# Patient Record
Sex: Female | Born: 1935 | Race: White | Hispanic: No | Smoking: Never smoker
Health system: Southern US, Community
[De-identification: ages and names within clinical notes are randomized; demographics above are authoritative.]

## PROBLEM LIST (undated history)

## (undated) DIAGNOSIS — K219 Gastro-esophageal reflux disease without esophagitis: Secondary | ICD-10-CM

## (undated) DIAGNOSIS — G4730 Sleep apnea, unspecified: Secondary | ICD-10-CM

## (undated) DIAGNOSIS — K5792 Diverticulitis of intestine, part unspecified, without perforation or abscess without bleeding: Secondary | ICD-10-CM

## (undated) DIAGNOSIS — E785 Hyperlipidemia, unspecified: Secondary | ICD-10-CM

## (undated) DIAGNOSIS — M5090 Cervical disc disorder, unspecified, unspecified cervical region: Secondary | ICD-10-CM

## (undated) DIAGNOSIS — M1990 Unspecified osteoarthritis, unspecified site: Secondary | ICD-10-CM

## (undated) DIAGNOSIS — N200 Calculus of kidney: Secondary | ICD-10-CM

## (undated) DIAGNOSIS — I10 Essential (primary) hypertension: Secondary | ICD-10-CM

## (undated) DIAGNOSIS — R32 Unspecified urinary incontinence: Secondary | ICD-10-CM

## (undated) DIAGNOSIS — K635 Polyp of colon: Secondary | ICD-10-CM

## (undated) DIAGNOSIS — N390 Urinary tract infection, site not specified: Secondary | ICD-10-CM

## (undated) DIAGNOSIS — C801 Malignant (primary) neoplasm, unspecified: Secondary | ICD-10-CM

## (undated) DIAGNOSIS — I509 Heart failure, unspecified: Secondary | ICD-10-CM

## (undated) DIAGNOSIS — I519 Heart disease, unspecified: Secondary | ICD-10-CM

## (undated) DIAGNOSIS — I219 Acute myocardial infarction, unspecified: Secondary | ICD-10-CM

## (undated) DIAGNOSIS — R7302 Impaired glucose tolerance (oral): Secondary | ICD-10-CM

## (undated) HISTORY — DX: Heart disease, unspecified: I51.9

## (undated) HISTORY — DX: Acute myocardial infarction, unspecified: I21.9

## (undated) HISTORY — DX: Calculus of kidney: N20.0

## (undated) HISTORY — DX: Hyperlipidemia, unspecified: E78.5

## (undated) HISTORY — DX: Unspecified urinary incontinence: R32

## (undated) HISTORY — DX: Urinary tract infection, site not specified: N39.0

## (undated) HISTORY — PX: OTHER SURGICAL HISTORY: SHX169

## (undated) HISTORY — DX: Diverticulitis of intestine, part unspecified, without perforation or abscess without bleeding: K57.92

## (undated) HISTORY — DX: Impaired glucose tolerance (oral): R73.02

## (undated) HISTORY — DX: Essential (primary) hypertension: I10

## (undated) HISTORY — DX: Polyp of colon: K63.5

## (undated) HISTORY — DX: Gastro-esophageal reflux disease without esophagitis: K21.9

## (undated) HISTORY — DX: Cervical disc disorder, unspecified, unspecified cervical region: M50.90

## (undated) HISTORY — DX: Unspecified osteoarthritis, unspecified site: M19.90

## (undated) HISTORY — PX: CORONARY ARTERY BYPASS GRAFT: SHX141

## (undated) HISTORY — DX: Malignant (primary) neoplasm, unspecified: C80.1

## (undated) HISTORY — DX: Sleep apnea, unspecified: G47.30

## (undated) HISTORY — DX: Heart failure, unspecified: I50.9

---

## 1999-06-30 ENCOUNTER — Encounter: Payer: Self-pay | Admitting: Emergency Medicine

## 1999-06-30 ENCOUNTER — Emergency Department (HOSPITAL_COMMUNITY): Admission: EM | Admit: 1999-06-30 | Discharge: 1999-06-30 | Payer: Self-pay | Admitting: Emergency Medicine

## 1999-08-12 HISTORY — PX: ABDOMINAL HYSTERECTOMY: SHX81

## 1999-09-12 HISTORY — PX: OTHER SURGICAL HISTORY: SHX169

## 1999-12-03 ENCOUNTER — Ambulatory Visit (HOSPITAL_COMMUNITY): Admission: RE | Admit: 1999-12-03 | Discharge: 1999-12-03 | Payer: Self-pay | Admitting: Oncology

## 1999-12-03 ENCOUNTER — Encounter: Payer: Self-pay | Admitting: Oncology

## 1999-12-25 ENCOUNTER — Encounter: Payer: Self-pay | Admitting: Oncology

## 1999-12-25 ENCOUNTER — Ambulatory Visit (HOSPITAL_COMMUNITY): Admission: RE | Admit: 1999-12-25 | Discharge: 1999-12-25 | Payer: Self-pay | Admitting: Oncology

## 2000-08-11 HISTORY — PX: BREAST BIOPSY: SHX20

## 2001-03-31 ENCOUNTER — Emergency Department (HOSPITAL_COMMUNITY): Admission: EM | Admit: 2001-03-31 | Discharge: 2001-03-31 | Payer: Self-pay | Admitting: Emergency Medicine

## 2003-08-03 ENCOUNTER — Ambulatory Visit (HOSPITAL_COMMUNITY): Admission: RE | Admit: 2003-08-03 | Discharge: 2003-08-03 | Payer: Self-pay | Admitting: Oncology

## 2003-08-12 HISTORY — PX: CATARACT EXTRACTION: SUR2

## 2004-05-15 ENCOUNTER — Encounter: Admission: RE | Admit: 2004-05-15 | Discharge: 2004-05-15 | Payer: Self-pay | Admitting: Oncology

## 2004-08-16 ENCOUNTER — Ambulatory Visit: Payer: Self-pay | Admitting: Oncology

## 2004-10-07 ENCOUNTER — Encounter: Admission: RE | Admit: 2004-10-07 | Discharge: 2004-10-07 | Payer: Self-pay | Admitting: Oncology

## 2005-02-13 ENCOUNTER — Ambulatory Visit: Payer: Self-pay | Admitting: Oncology

## 2005-10-08 ENCOUNTER — Encounter: Admission: RE | Admit: 2005-10-08 | Discharge: 2005-10-08 | Payer: Self-pay | Admitting: Oncology

## 2005-11-04 ENCOUNTER — Ambulatory Visit: Payer: Self-pay | Admitting: Oncology

## 2006-05-11 HISTORY — PX: OTHER SURGICAL HISTORY: SHX169

## 2006-05-14 ENCOUNTER — Ambulatory Visit: Payer: Self-pay | Admitting: Oncology

## 2006-05-18 LAB — CBC WITH DIFFERENTIAL/PLATELET
BASO%: 0.5 % (ref 0.0–2.0)
Basophils Absolute: 0 10*3/uL (ref 0.0–0.1)
Eosinophils Absolute: 0.2 10*3/uL (ref 0.0–0.5)
HCT: 35.6 % (ref 34.8–46.6)
HGB: 12.1 g/dL (ref 11.6–15.9)
LYMPH%: 26 % (ref 14.0–48.0)
MONO#: 0.6 10*3/uL (ref 0.1–0.9)
NEUT#: 3.9 10*3/uL (ref 1.5–6.5)
NEUT%: 61.3 % (ref 39.6–76.8)
Platelets: 251 10*3/uL (ref 145–400)
WBC: 6.3 10*3/uL (ref 3.9–10.0)
lymph#: 1.6 10*3/uL (ref 0.9–3.3)

## 2006-05-18 LAB — MORPHOLOGY
PLT EST: ADEQUATE
RBC Comments: NORMAL

## 2006-11-30 ENCOUNTER — Ambulatory Visit: Payer: Self-pay | Admitting: Oncology

## 2006-12-01 ENCOUNTER — Encounter: Admission: RE | Admit: 2006-12-01 | Discharge: 2006-12-01 | Payer: Self-pay | Admitting: Oncology

## 2006-12-03 LAB — CBC WITH DIFFERENTIAL/PLATELET
BASO%: 0.7 % (ref 0.0–2.0)
EOS%: 4.2 % (ref 0.0–7.0)
HCT: 35.5 % (ref 34.8–46.6)
MCH: 34.1 pg — ABNORMAL HIGH (ref 26.0–34.0)
MCHC: 34.9 g/dL (ref 32.0–36.0)
MONO#: 0.4 10*3/uL (ref 0.1–0.9)
NEUT%: 66.2 % (ref 39.6–76.8)
RBC: 3.64 10*6/uL — ABNORMAL LOW (ref 3.70–5.32)
RDW: 13.2 % (ref 11.3–14.5)
WBC: 5.4 10*3/uL (ref 3.9–10.0)
lymph#: 1.2 10*3/uL (ref 0.9–3.3)

## 2006-12-03 LAB — COMPREHENSIVE METABOLIC PANEL
ALT: 22 U/L (ref 0–35)
AST: 23 U/L (ref 0–37)
CO2: 30 mEq/L (ref 19–32)
Calcium: 10.1 mg/dL (ref 8.4–10.5)
Chloride: 103 mEq/L (ref 96–112)
Creatinine, Ser: 0.84 mg/dL (ref 0.40–1.20)
Sodium: 140 mEq/L (ref 135–145)
Total Protein: 6.6 g/dL (ref 6.0–8.3)

## 2006-12-03 LAB — CANCER ANTIGEN 27.29: CA 27.29: 13 U/mL (ref 0–39)

## 2007-02-10 ENCOUNTER — Encounter: Payer: Self-pay | Admitting: Pulmonary Disease

## 2007-02-21 ENCOUNTER — Encounter: Payer: Self-pay | Admitting: Pulmonary Disease

## 2007-03-24 ENCOUNTER — Encounter: Payer: Self-pay | Admitting: Pulmonary Disease

## 2007-11-26 ENCOUNTER — Encounter: Payer: Self-pay | Admitting: Pulmonary Disease

## 2007-11-30 ENCOUNTER — Ambulatory Visit: Payer: Self-pay | Admitting: Oncology

## 2007-12-02 ENCOUNTER — Encounter: Admission: RE | Admit: 2007-12-02 | Discharge: 2007-12-02 | Payer: Self-pay | Admitting: Oncology

## 2007-12-02 LAB — COMPREHENSIVE METABOLIC PANEL
AST: 24 U/L (ref 0–37)
Albumin: 4 g/dL (ref 3.5–5.2)
Alkaline Phosphatase: 51 U/L (ref 39–117)
BUN: 27 mg/dL — ABNORMAL HIGH (ref 6–23)
Creatinine, Ser: 0.93 mg/dL (ref 0.40–1.20)
Glucose, Bld: 109 mg/dL — ABNORMAL HIGH (ref 70–99)
Potassium: 4 mEq/L (ref 3.5–5.3)
Total Bilirubin: 0.6 mg/dL (ref 0.3–1.2)

## 2007-12-02 LAB — CANCER ANTIGEN 27.29: CA 27.29: 15 U/mL (ref 0–39)

## 2007-12-02 LAB — CBC WITH DIFFERENTIAL/PLATELET
Basophils Absolute: 0 10*3/uL (ref 0.0–0.1)
EOS%: 1.4 % (ref 0.0–7.0)
HGB: 11.5 g/dL — ABNORMAL LOW (ref 11.6–15.9)
LYMPH%: 22 % (ref 14.0–48.0)
MCH: 33.7 pg (ref 26.0–34.0)
MCV: 94.8 fL (ref 81.0–101.0)
MONO%: 3.4 % (ref 0.0–13.0)
Platelets: 288 10*3/uL (ref 145–400)
RBC: 3.42 10*6/uL — ABNORMAL LOW (ref 3.70–5.32)
RDW: 13.7 % (ref 11.3–14.5)

## 2008-06-18 ENCOUNTER — Encounter: Payer: Self-pay | Admitting: Pulmonary Disease

## 2008-12-01 ENCOUNTER — Ambulatory Visit: Payer: Self-pay | Admitting: Oncology

## 2008-12-05 ENCOUNTER — Encounter: Admission: RE | Admit: 2008-12-05 | Discharge: 2008-12-05 | Payer: Self-pay | Admitting: Oncology

## 2008-12-05 LAB — CBC WITH DIFFERENTIAL/PLATELET
EOS%: 10.3 % — ABNORMAL HIGH (ref 0.0–7.0)
Eosinophils Absolute: 0.7 10*3/uL — ABNORMAL HIGH (ref 0.0–0.5)
MCV: 96.4 fL (ref 79.5–101.0)
MONO%: 8.4 % (ref 0.0–14.0)
NEUT#: 4.1 10*3/uL (ref 1.5–6.5)
RBC: 3.68 10*6/uL — ABNORMAL LOW (ref 3.70–5.45)
RDW: 13.6 % (ref 11.2–14.5)
lymph#: 1.3 10*3/uL (ref 0.9–3.3)

## 2008-12-06 LAB — COMPREHENSIVE METABOLIC PANEL
AST: 22 U/L (ref 0–37)
Albumin: 4.1 g/dL (ref 3.5–5.2)
Alkaline Phosphatase: 62 U/L (ref 39–117)
Potassium: 4.3 mEq/L (ref 3.5–5.3)
Sodium: 139 mEq/L (ref 135–145)
Total Protein: 6.8 g/dL (ref 6.0–8.3)

## 2009-12-10 ENCOUNTER — Ambulatory Visit: Payer: Self-pay | Admitting: Oncology

## 2009-12-11 ENCOUNTER — Encounter: Admission: RE | Admit: 2009-12-11 | Discharge: 2009-12-11 | Payer: Self-pay | Admitting: Oncology

## 2009-12-11 LAB — CBC WITH DIFFERENTIAL/PLATELET
Eosinophils Absolute: 0.1 10*3/uL (ref 0.0–0.5)
MCH: 32.8 pg (ref 25.1–34.0)
MCV: 98.1 fL (ref 79.5–101.0)
MONO#: 0.2 10*3/uL (ref 0.1–0.9)
MONO%: 4.3 % (ref 0.0–14.0)
NEUT#: 4.1 10*3/uL (ref 1.5–6.5)
NEUT%: 70.7 % (ref 38.4–76.8)

## 2009-12-11 LAB — VITAMIN D 25 HYDROXY (VIT D DEFICIENCY, FRACTURES): Vit D, 25-Hydroxy: 44 ng/mL (ref 30–89)

## 2009-12-11 LAB — COMPREHENSIVE METABOLIC PANEL
AST: 26 U/L (ref 0–37)
Alkaline Phosphatase: 52 U/L (ref 39–117)
BUN: 14 mg/dL (ref 6–23)
Creatinine, Ser: 1.05 mg/dL (ref 0.40–1.20)
Glucose, Bld: 202 mg/dL — ABNORMAL HIGH (ref 70–99)
Potassium: 4.4 mEq/L (ref 3.5–5.3)
Total Bilirubin: 0.7 mg/dL (ref 0.3–1.2)
Total Protein: 6.8 g/dL (ref 6.0–8.3)

## 2010-03-28 ENCOUNTER — Encounter: Payer: Self-pay | Admitting: Pulmonary Disease

## 2010-07-08 DIAGNOSIS — I428 Other cardiomyopathies: Secondary | ICD-10-CM | POA: Insufficient documentation

## 2010-07-08 DIAGNOSIS — C50919 Malignant neoplasm of unspecified site of unspecified female breast: Secondary | ICD-10-CM | POA: Insufficient documentation

## 2010-07-08 DIAGNOSIS — I2510 Atherosclerotic heart disease of native coronary artery without angina pectoris: Secondary | ICD-10-CM | POA: Insufficient documentation

## 2010-07-08 DIAGNOSIS — E7800 Pure hypercholesterolemia, unspecified: Secondary | ICD-10-CM | POA: Insufficient documentation

## 2010-07-08 DIAGNOSIS — G4710 Hypersomnia, unspecified: Secondary | ICD-10-CM | POA: Insufficient documentation

## 2010-07-08 DIAGNOSIS — F0390 Unspecified dementia without behavioral disturbance: Secondary | ICD-10-CM | POA: Insufficient documentation

## 2010-07-08 DIAGNOSIS — G4730 Sleep apnea, unspecified: Secondary | ICD-10-CM

## 2010-07-08 DIAGNOSIS — I509 Heart failure, unspecified: Secondary | ICD-10-CM | POA: Insufficient documentation

## 2010-07-08 DIAGNOSIS — M810 Age-related osteoporosis without current pathological fracture: Secondary | ICD-10-CM | POA: Insufficient documentation

## 2010-07-08 DIAGNOSIS — G4733 Obstructive sleep apnea (adult) (pediatric): Secondary | ICD-10-CM | POA: Insufficient documentation

## 2010-07-08 DIAGNOSIS — I10 Essential (primary) hypertension: Secondary | ICD-10-CM | POA: Insufficient documentation

## 2010-07-08 DIAGNOSIS — G2581 Restless legs syndrome: Secondary | ICD-10-CM | POA: Insufficient documentation

## 2010-07-09 ENCOUNTER — Ambulatory Visit: Payer: Self-pay | Admitting: Pulmonary Disease

## 2010-07-09 DIAGNOSIS — I219 Acute myocardial infarction, unspecified: Secondary | ICD-10-CM | POA: Insufficient documentation

## 2010-08-13 ENCOUNTER — Telehealth (INDEPENDENT_AMBULATORY_CARE_PROVIDER_SITE_OTHER): Payer: Self-pay | Admitting: *Deleted

## 2010-08-29 ENCOUNTER — Telehealth (INDEPENDENT_AMBULATORY_CARE_PROVIDER_SITE_OTHER): Payer: Self-pay | Admitting: *Deleted

## 2010-09-03 ENCOUNTER — Telehealth (INDEPENDENT_AMBULATORY_CARE_PROVIDER_SITE_OTHER): Payer: Self-pay | Admitting: *Deleted

## 2010-09-10 NOTE — Assessment & Plan Note (Signed)
Summary: self referral for management of osa   Copy to:  Self Referral Primary Provider/Referring Provider:  Alease Frame MD   CC:  Pt currenly on cpap machine and she just needs a yearly follow up. Marland Kitchen  History of Present Illness: The pt is a very pleasant 77y/o female who comes in today as a self referral for management of osa.  She was diagnosed with severe OSA in 2008, where a npsg revealed AHI 64/hr.  She underwent a titration study and found to have optimal cpap pressure of 11cm.  She has been wearing cpap compliantly since, and feels it is working well for her.  She is new to the area, and if requiring a new dme company for supplies and machine maintenance.  She has a heated humidifier, and currently is using a nasal mask.  She goes to bed at 930pm, and arises at 6am to start her day.  She feels that she sleeps well, and is rested upon arising.  She admits that some evenings she will fall asleep on sofa and not wear cpap.  Despite wearing cpap, she still has some sleepiness with reading and watching tv during the day and evening.  She has not gained weight since her last sleep study.  Her epworth score is only 6 today.  She also has a diagnosis of RLS, but denies symptoms in the evening and is unsure if she kicks (but is taking requip currently).   Current Medications (verified): 1)  Alendronate Sodium 70 Mg Tabs (Alendronate Sodium) .... Once A Week 2)  Aricept 10 Mg Tabs (Donepezil Hcl) .... Once Daily 3)  Aspirin 325 Mg Tabs (Aspirin) .... Once Daily 4)  Bipap 5)  Calcium-Vitamin D 600-125 Mg-Unit Tabs (Calcium-Vitamin D) .... Once Daily 6)  Carvedilol 3.125 Mg Tabs (Carvedilol) .... Once Daily 7)  Detrol La 4 Mg Xr24h-Cap (Tolterodine Tartrate) .... Once Daily 8)  Hydrocodone-Acetaminophen 5-500 Mg Tabs (Hydrocodone-Acetaminophen) .Marland Kitchen.. 1 Tablet As Needed Every 6 Hrs 9)  Lipitor 80 Mg Tabs (Atorvastatin Calcium) .... Once Daily 10)  Namenda 10 Mg Tabs (Memantine Hcl) .... Once  Daily 11)  Ropinirole Hcl 1 Mg Tabs (Ropinirole Hcl) .... Once Daily At Bedtime 12)  Sertraline Hcl 100 Mg Tabs (Sertraline Hcl) .... Once Daily 13)  Maxivision Occular Formula .Marland Kitchen.. 1 Tablet Two Times A Day  Allergies (verified): No Known Drug Allergies  Past History:  Past Medical History:  CHF (ICD-428.0) RESTLESS LEGS SYNDROME (ICD-333.94)? HYPERSOMNIA, ASSOCIATED WITH SLEEP APNEA (ICD-780.53) OSTEOPOROSIS (ICD-733.00) OBSTRUCTIVE SLEEP APNEA (ICD-327.23) ESSENTIAL HYPERTENSION (ICD-401.9) HYPERCHOLESTEROLEMIA (ICD-272.0) DEMENTIA (ICD-294.8) CORONARY ATHEROSCLEROSIS (ICD-414.00)--s/p stents, cabg CARDIOMYOPATHY (ICD-425.4)--EF 45% by echo 2009. BREAST CANCER (ICD-174.9)   Myocardial Infarction  Past Surgical History: CABG X3 cataract surgery hysterectomy breast surgery modified radical left mastectomy remove secondary memb catar cornero-scleral sect iridectomy stent x2 carpal tunnel surgery both hands  Family History: Reviewed history from 07/08/2010 and no changes required. Family History Breast Cancer: mother, sister lung cancer--sister Family History Diabetes: mother heart disease--mother clotting disorder: father  Social History: Reviewed history from 07/08/2010 and no changes required. occupation: retired-[employer] lives alone Patient never smoked.   Review of Systems       The patient complains of indigestion, weight change, difficulty swallowing, and ear ache.  The patient denies shortness of breath with activity, shortness of breath at rest, productive cough, non-productive cough, coughing up blood, chest pain, irregular heartbeats, acid heartburn, loss of appetite, abdominal pain, sore throat, tooth/dental problems, headaches, nasal congestion/difficulty breathing through nose,  sneezing, itching, anxiety, depression, hand/feet swelling, joint stiffness or pain, rash, change in color of mucus, and fever.    Vital Signs:  Patient profile:    77 year old female Height:      60 inches Weight:      105.25 pounds BMI:     20.63 O2 Sat:      96 % on Room air Temp:     97.5 degrees F oral Pulse rate:   78 / minute BP sitting:   122 / 64  (right arm) Cuff size:   regular  Vitals Entered By: Carver Fila (July 09, 2010 11:09 AM)  O2 Flow:  Room air CC: Pt currenly on cpap machine and she just needs a yearly follow up.  Comments meds and allergies updated Phone number updated Carver Fila  July 09, 2010 11:09 AM    Physical Exam  General:  wd female in nad Eyes:  PERRLA and EOMI.   Nose:  very narrowed bilat, but no obstruction Mouth:  normal uvula and palate, no exudates. Neck:  no jvd, tmg, LN Lungs:  clear to auscultation Heart:  rrr, no mrg Abdomen:  soft and nontender, bs + Extremities:  minimal edema, no cyanosis  pulses intact distally Neurologic:  alert and oriented, moves all 4.   Impression & Recommendations:  Problem # 1:  OBSTRUCTIVE SLEEP APNEA (ICD-327.23) the pt has a history of severe osa, but has been doing well with cpap.  She is wearing fairly compliantly with no complaints, but does have some daytime alertness issues.  I think at this point, will refer to a new dme company for supplies and machine maintenance.  I also feel that it would be worthwhile to re-optimize pressure for her on auto mode for 2 weeks, especially with some sleepiness issues during the day.  The pt is agreeable to this approach.    Medications Added to Medication List This Visit: 1)  Maxivision Occular Formula  .Marland Kitchen.. 1 tablet two times a day  Other Orders: New Patient Level IV (16109) DME Referral (DME)  Patient Instructions: 1)  will get you referred to a new dme for supplies and machine maintenance 2)  will get you a new mask, and recalibrate your pressure.  I will let you know the results. 3)  followup with me in one year if doing well.   Immunization History:  Influenza Immunization History:    Influenza:   historical (05/11/2010)

## 2010-09-12 NOTE — Letter (Signed)
Summary: [facility]  [facility]   Imported By: Lester Alden 07/27/2010 09:21:35  _____________________________________________________________________  External Attachment:    Type:   Image     Comment:   External Document

## 2010-09-12 NOTE — Progress Notes (Signed)
Summary: regarding titration settings  Phone Note Other Incoming   Caller: Rayla with [facility] Summary of Call: Rayla phoned regarding settings for the titration study. She can take these verbally over the phone it is normally 5 to 50. Rayla can be reached [phone] Initial call taken by: Vedia Coffer,  August 29, 2010 1:14 PM  Follow-up for Phone Call        Spoke with rep from [place] in [place].  She states that they need a range for autotitration study. Verbal order is okay.  KC, pls advise thanks! Follow-up by: Vernie Murders,  August 29, 2010 3:06 PM  Additional Follow-up for Phone Call Additional follow up Details #1::        it is 5-20cm Additional Follow-up by: Barbaraann Share MD,  August 29, 2010 5:44 PM    Additional Follow-up for Phone Call Additional follow up Details #2::    Spoke with Amber at [place] and gave her verbal order for setting of 5-20cm.  Also faxed new setting orders to [facility] at [phone]. Abigail Miyamoto RN  August 30, 2010 8:52 AM

## 2010-09-12 NOTE — Progress Notes (Signed)
Summary: records  Phone Note Other Incoming Call back at [phone]   Caller: [email or web address] Summary of Call: Request rx for cpap supplies, baseline sleep study, and ov prior to sleep study, will fax request. Initial call taken by: Darletta Moll,  August 13, 2010 11:53 AM  Follow-up for Phone Call        [facility] is requesting sleep study records, OV note prior to sleep study and rx for cpap supplies. Looks like this was already sent. I will forward to [facility] to address. Carron Curie CMA  August 13, 2010 3:00 PM   Additional Follow-up for Phone Call Additional follow up Details #1::        records were faxed to [place] [facility] in nov 11 however i refaxed them to [phone] Additional Follow-up by: Oneita Jolly,  August 14, 2010 11:45 AM

## 2010-09-12 NOTE — Progress Notes (Signed)
Summary: [facility] order clarification/cb  Phone Note From Other Clinic   Caller: [phone] [facility] Call For: clance Summary of Call: needs verification on the order 08/30/2010 Initial call taken by: Lacinda Axon,  September 03, 2010 11:21 AM  Follow-up for Phone Call        called and spoke with Tammy Sours from [facility].  Tammy Sours just wanted to verify KC's orders for auto x 2 weeks with download.  Informed Tammy Sours that was correct.  Nothing further needed.  Aundra Millet Reynolds LPN  September 03, 2010 11:27 AM

## 2010-09-28 ENCOUNTER — Encounter: Payer: Self-pay | Admitting: Pulmonary Disease

## 2010-10-02 NOTE — Miscellaneous (Signed)
Summary: optimal cpap 17cm   Clinical Lists Changes  Orders: Added new Referral order of DME Referral (DME) - Signed

## 2010-10-18 ENCOUNTER — Encounter: Payer: Self-pay | Admitting: Pulmonary Disease

## 2010-10-29 ENCOUNTER — Ambulatory Visit (INDEPENDENT_AMBULATORY_CARE_PROVIDER_SITE_OTHER): Payer: Medicare Other | Admitting: Pulmonary Disease

## 2010-10-29 VITALS — BP 96/58 | HR 75 | Temp 97.9°F | Ht 59.0 in | Wt 107.0 lb

## 2010-10-29 DIAGNOSIS — G4733 Obstructive sleep apnea (adult) (pediatric): Secondary | ICD-10-CM

## 2010-10-29 NOTE — Assessment & Plan Note (Signed)
The pt is doing well with cpap at her new optimal pressure.  She is sleeping better, and has improved daytime alertness.  She is having machine malfunctions, and will get her dme to check her machine.  I suspect she needs a new one.

## 2010-10-29 NOTE — Progress Notes (Signed)
  Subjective:    Patient ID: Anne Snyder, female    DOB: [birth year], 77 y.o.   MRN: [id number]  HPI The pt comes in today for f/u of her known osa.  She has had her pressure optimized to 17cm since her last visit, and is tolerating this well.  Her mask fits well, but her machine is making noise and showing an "error message". She has seen a difference in her sleep and daytime alertness.    Review of Systems Patient complains of head congestion and joint stiffness or pain. Pt denies shortness of breath with activity or at rest, productive cough, non productive cough, coughing up blood, chest pain, irregular heartbeats, acid heartburn, indigestion, loss of appetite, weight change abdominal pain, difficulty swallowing, sore throat, headache, sneezing, itching, ear ache, anxiety, depression, hand or feet swelling, rash or fever.     Objective:   Physical Exam  Constitutional: She is oriented to person, place, and time. She appears well-developed.  HENT:       No skin breakdown or pressure necrosis from cpap mask   Musculoskeletal: She exhibits no edema.  Neurological: She is alert and oriented to person, place, and time.       Does not appear sleepy Moves all 4   Skin:       No cyanosis           Assessment & Plan:  OBSTRUCTIVE SLEEP APNEA The pt is doing well with cpap at her new optimal pressure.  She is sleeping better, and has improved daytime alertness.  She is having machine malfunctions, and will get her dme to check her machine.  I suspect she needs a new one.

## 2010-10-29 NOTE — Patient Instructions (Addendum)
Continue on cpap, and call if issues with tolerance Will have dme check your machine, possibly getting you a new one. followup with me in 12mos

## 2010-11-01 ENCOUNTER — Telehealth: Payer: Self-pay | Admitting: Pulmonary Disease

## 2010-11-01 NOTE — Telephone Encounter (Signed)
Called and spoke with pts daughter and she stated that she has been on the phone all afternoon and the DME company has now found her a loaner that will not cost her anything while hers is being repaired.  They will also drop off a humidifier for the pt and they will let them know if her original machine can be fixed.

## 2010-11-01 NOTE — Telephone Encounter (Signed)
It is not unusual for the dme to get hers fixed and GIVE her a loaner until it comes back.  The pt's insurance pays a monthly fee for maintenance, and the loaner should be FREE.  If it is not, they need to talk with dme.  If not making headway, call us back and we can call dme.

## 2010-11-01 NOTE — Telephone Encounter (Signed)
Pt's daughter, Liborio Nixon, says that her mother's CPAP machine has been alarming with an electrical error. The machine is only 67 1/75 years old and [facility] is sending it back to the manufacturer to be checked. Pt is renting a CPAP for $75 a day until they know if hers can be fixed. Daughter says [employer] will not pay for a new one until 2013. Per daughter, she would like to know if there is anyway to get her mother a new CPAP machine before then without her having to pay out of pocket for it. Will forward to Dr. Shelle Iron for recs. Pls advise.Michel Bickers, MA

## 2011-03-12 ENCOUNTER — Encounter: Payer: Self-pay | Admitting: Internal Medicine

## 2011-03-12 DIAGNOSIS — Z0000 Encounter for general adult medical examination without abnormal findings: Secondary | ICD-10-CM | POA: Insufficient documentation

## 2011-03-14 ENCOUNTER — Encounter: Payer: Self-pay | Admitting: Internal Medicine

## 2011-03-14 ENCOUNTER — Other Ambulatory Visit (INDEPENDENT_AMBULATORY_CARE_PROVIDER_SITE_OTHER): Payer: Medicare Other

## 2011-03-14 ENCOUNTER — Telehealth: Payer: Self-pay | Admitting: *Deleted

## 2011-03-14 ENCOUNTER — Ambulatory Visit (INDEPENDENT_AMBULATORY_CARE_PROVIDER_SITE_OTHER): Payer: Medicare Other | Admitting: Internal Medicine

## 2011-03-14 VITALS — BP 108/58 | HR 73 | Temp 97.7°F | Ht 59.0 in | Wt 101.5 lb

## 2011-03-14 DIAGNOSIS — R7309 Other abnormal glucose: Secondary | ICD-10-CM

## 2011-03-14 DIAGNOSIS — R5383 Other fatigue: Secondary | ICD-10-CM

## 2011-03-14 DIAGNOSIS — E7800 Pure hypercholesterolemia, unspecified: Secondary | ICD-10-CM

## 2011-03-14 DIAGNOSIS — E785 Hyperlipidemia, unspecified: Secondary | ICD-10-CM | POA: Insufficient documentation

## 2011-03-14 DIAGNOSIS — I219 Acute myocardial infarction, unspecified: Secondary | ICD-10-CM

## 2011-03-14 DIAGNOSIS — K219 Gastro-esophageal reflux disease without esophagitis: Secondary | ICD-10-CM | POA: Insufficient documentation

## 2011-03-14 DIAGNOSIS — R7302 Impaired glucose tolerance (oral): Secondary | ICD-10-CM

## 2011-03-14 DIAGNOSIS — R5381 Other malaise: Secondary | ICD-10-CM

## 2011-03-14 DIAGNOSIS — Z0000 Encounter for general adult medical examination without abnormal findings: Secondary | ICD-10-CM

## 2011-03-14 DIAGNOSIS — R21 Rash and other nonspecific skin eruption: Secondary | ICD-10-CM | POA: Insufficient documentation

## 2011-03-14 DIAGNOSIS — N200 Calculus of kidney: Secondary | ICD-10-CM | POA: Insufficient documentation

## 2011-03-14 DIAGNOSIS — N390 Urinary tract infection, site not specified: Secondary | ICD-10-CM | POA: Insufficient documentation

## 2011-03-14 DIAGNOSIS — I509 Heart failure, unspecified: Secondary | ICD-10-CM | POA: Insufficient documentation

## 2011-03-14 DIAGNOSIS — Z23 Encounter for immunization: Secondary | ICD-10-CM

## 2011-03-14 DIAGNOSIS — K5792 Diverticulitis of intestine, part unspecified, without perforation or abscess without bleeding: Secondary | ICD-10-CM | POA: Insufficient documentation

## 2011-03-14 DIAGNOSIS — M1990 Unspecified osteoarthritis, unspecified site: Secondary | ICD-10-CM | POA: Insufficient documentation

## 2011-03-14 DIAGNOSIS — G4730 Sleep apnea, unspecified: Secondary | ICD-10-CM | POA: Insufficient documentation

## 2011-03-14 DIAGNOSIS — I10 Essential (primary) hypertension: Secondary | ICD-10-CM | POA: Insufficient documentation

## 2011-03-14 DIAGNOSIS — K635 Polyp of colon: Secondary | ICD-10-CM | POA: Insufficient documentation

## 2011-03-14 HISTORY — DX: Acute myocardial infarction, unspecified: I21.9

## 2011-03-14 HISTORY — DX: Impaired glucose tolerance (oral): R73.02

## 2011-03-14 LAB — URINALYSIS, ROUTINE W REFLEX MICROSCOPIC
Bilirubin Urine: NEGATIVE
Nitrite: NEGATIVE
Specific Gravity, Urine: >=1.03 (ref 1.000–1.030)
Total Protein, Urine: NEGATIVE
pH: 5.5 (ref 5.0–8.0)

## 2011-03-14 LAB — LIPID PANEL
LDL Cholesterol: 43 mg/dL (ref 0–99)
Total CHOL/HDL Ratio: 2
Triglycerides: 56 mg/dL (ref 0.0–149.0)

## 2011-03-14 LAB — HEPATIC FUNCTION PANEL
AST: 21 U/L (ref 0–37)
Alkaline Phosphatase: 72 U/L (ref 39–117)
Bilirubin, Direct: 0.1 mg/dL (ref 0.0–0.3)
Total Bilirubin: 0.7 mg/dL (ref 0.3–1.2)

## 2011-03-14 LAB — CBC WITH DIFFERENTIAL/PLATELET
Eosinophils Absolute: 0.2 10*3/uL (ref 0.0–0.7)
Eosinophils Relative: 2.8 % (ref 0.0–5.0)
Lymphocytes Relative: 25.1 % (ref 12.0–46.0)
MCHC: 33.8 g/dL (ref 30.0–36.0)
MCV: 95.9 fl (ref 78.0–100.0)
Monocytes Relative: 10.5 % (ref 3.0–12.0)
Neutro Abs: 3.7 10*3/uL (ref 1.4–7.7)
Neutrophils Relative %: 60.7 % (ref 43.0–77.0)
RDW: 13.4 % (ref 11.5–14.6)
WBC: 6.1 10*3/uL (ref 4.5–10.5)

## 2011-03-14 LAB — BASIC METABOLIC PANEL
CO2: 29 mEq/L (ref 19–32)
Calcium: 9.7 mg/dL (ref 8.4–10.5)
GFR: 63.76 mL/min (ref >60.00)
Glucose, Bld: 96 mg/dL (ref 70–99)

## 2011-03-14 LAB — HEMOGLOBIN A1C: Hgb A1c MFr Bld: 6 % (ref 4.6–6.5)

## 2011-03-14 MED ORDER — TRIAMCINOLONE ACETONIDE 0.1 % EX CREA: TOPICAL_CREAM | Freq: Two times a day (BID) | CUTANEOUS | Status: DC

## 2011-03-14 MED ORDER — CEPHALEXIN 500 MG PO CAPS: 500.0000 mg | ORAL_CAPSULE | Freq: Four times a day (QID) | ORAL | Status: AC

## 2011-03-14 MED ORDER — PNEUMOCOCCAL VAC POLYVALENT 25 MCG/0.5ML IJ INJ: 0.5000 mL | INJECTION | Freq: Once | INTRAMUSCULAR | Status: DC

## 2011-03-14 MED ORDER — TETANUS-DIPHTH-ACELL PERTUSSIS 5-2.5-18.5 LF-MCG/0.5 IM SUSP: 0.5000 mL | Freq: Once | INTRAMUSCULAR | Status: DC

## 2011-03-14 NOTE — Assessment & Plan Note (Signed)
stable overall by hx and exam, most recent data reviewed with pt, and pt to continue medical treatment as before  Lab Results  Component Value Date   HGBA1C 6.0 03/14/2011

## 2011-03-14 NOTE — Progress Notes (Signed)
Subjective:    Patient ID: Anne Snyder, female    DOB: 25-May-1927, 77 y.o.   MRN: [id number]  HPI  Here to establish as new pt with daughter, pt with dementia, has ongoing fecal incont helped with strawbery gummy fiber pills some worse recently when she stopped the fiber but now improved again without other bowel change or abd pain or blood;  But also with rash to left cheek and temple area with itch that she tends to rub and scratch several times per day for the past 1-2 wks.  Overall o/w doing ok, divorced husband living in [place], pt is now being moved from [place] area to [place], temporarily with one daughter who is here today, but more permanently with another family member locally when the residence is ready.   Denies urinary symptoms such as dysuria, frequency, urgency,or hematuria.   No UTI for over a yr.   Does have sense of ongoing fatigue.   Did also have swallow evaluation spring 2012 negative.  No other new complaints today.  Pt denies chest pain, increased sob or doe, wheezing, orthopnea, PND, increased LE swelling, palpitations, dizziness or syncope.  Pt denies new neurological symptoms such as new headache, or facial or extremity weakness or numbness   Pt denies polydipsia, polyuria. Past Medical History  Diagnosis Date  . Sleep apnea   . Cancer     Breast malignancy  . Diverticulitis   . Heart disease   . Colon polyps   . Urine incontinence   . UTI (lower urinary tract infection)   . Impaired glucose tolerance 03/14/2011  . Arthritis   . CHF (congestive heart failure)   . GERD (gastroesophageal reflux disease)   . Kidney stones   . Hypertension   . Hyperlipidemia   . Myocardial infarct 03/14/2011   Past Surgical History  Procedure Date  . Breast biopsy 2002  . Abdominal hysterectomy 2001  . Heart attack 12/2005 and 01/2006  . By pass surgury 05/2006  . Cataract extraction 2005  . Masectomy 09/1999  . Coronary artery bypass graft     reports that she has never  smoked. She does not have any smokeless tobacco history on file. She reports that she does not drink alcohol or use illicit drugs. family history includes Arthritis in her father, mother, and other; Cancer in her mother and other; Diabetes in her father and mother; and Stroke in her father and mother. Allergies  Allergen Reactions  . Sulfa Antibiotics    Current Outpatient Prescriptions on File Prior to Visit  Medication Sig Dispense Refill  . alendronate (FOSAMAX) 70 MG tablet Take 70 mg by mouth every 7 (seven) days. Take with a full glass of water on an empty stomach.       Marland Kitchen aspirin 325 MG tablet Take 325 mg by mouth daily.        Marland Kitchen atorvastatin (LIPITOR) 80 MG tablet Take 80 mg by mouth daily.        . Calcium Carbonate (CALTRATE 600 PO) Take 1 tablet by mouth 2 (two) times daily.        . carvedilol (COREG) 3.125 MG tablet Take once daily       . donepezil (ARICEPT) 23 MG TABS tablet Take 23 mg by mouth at bedtime.        Marland Kitchen HYDROcodone-acetaminophen (VICODIN) 5-500 MG per tablet Take 1 tablet by mouth every 6 (six) hours as needed.        . memantine (NAMENDA) 10 MG tablet  2 (two) times daily.       . Multiple Vitamins-Minerals (EYE VITAMINS) TABS Take by mouth. Two time a day       . OMEGA-3 KRILL OIL 300 MG CAPS Take 1 capsule by mouth daily.        Marland Kitchen omeprazole (PRILOSEC) 40 MG capsule Take 40 mg by mouth daily.        Marland Kitchen rOPINIRole (REQUIP) 1 MG tablet Take once daily at bedtime       . sertraline (ZOLOFT) 100 MG tablet Take 100 mg by mouth daily.        Marland Kitchen tolterodine (DETROL LA) 4 MG 24 hr capsule Take 4 mg by mouth daily.         No current facility-administered medications on file prior to visit.    Review of Systems Review of Systems  Constitutional: Negative for diaphoresis and unexpected weight change.  HENT: Negative for drooling and tinnitus.   Eyes: Negative for photophobia and visual disturbance.  Respiratory: Negative for choking and stridor.   Gastrointestinal:  Negative for vomiting and blood in stool.     Objective:   Physical Exam BP 108/58  Pulse 73  Temp(Src) 97.7 F (36.5 C) (Oral)  Ht 4\' 11"  (1.499 m)  Wt 101 lb 8 oz (46.04 kg)  BMI 20.50 kg/m2  SpO2 96% Physical Exam  VS noted Constitutional: Pt appears well-developed and well-nourished.  HENT: Head: Normocephalic.  Right Ear: External ear normal.  Left Ear: External ear normal.  Eyes: Conjunctivae and EOM are normal. Pupils are equal, round, and reactive to light.  Neck: Normal range of motion. Neck supple.  Cardiovascular: Normal rate and regular rhythm.   Pulmonary/Chest: Effort normal and breath sounds normal.  Abd:  Soft, NT, non-distended, + BS Neurological: Pt is alert. No cranial nerve deficit.  Skin: Skin is warm. No erythema. except for nondiscrete faint erythem rash, minor tender to left cheek and temple (not shingles as the duaghter is concerned having seen ads on TV recently) Psychiatric: Pt behavior is normal. Thought content c/w moderate ST memeory dysfunction      Assessment & Plan:

## 2011-03-14 NOTE — Telephone Encounter (Signed)
Message copied by Vernie Murders on Fri Mar 14, 2011  6:07 PM ------      Message from: Corwin Levins      Created: Caleen Essex Mar 14, 2011  5:27 PM      Regarding: please call pt's daughter regarding UTI       UA today quite convincing for prob UTI            For cephalexin course  - I sent rx to [facility]

## 2011-03-14 NOTE — Assessment & Plan Note (Signed)
stable overall by hx and exam, most recent data reviewed with pt, and pt to continue medical treatment as before  Lab Results  Component Value Date   LDLCALC 43 03/14/2011

## 2011-03-14 NOTE — Telephone Encounter (Signed)
No answer, left VM for patient to check w/her pharm and call office back w/any questions

## 2011-03-14 NOTE — Assessment & Plan Note (Signed)
Etiology unclear, Exam otherwise benign, to check labs as documented, follow with expectant management  

## 2011-03-14 NOTE — Assessment & Plan Note (Signed)
Exam Not charged   - but for tetanus and pneumonia shot today

## 2011-03-14 NOTE — Patient Instructions (Addendum)
Take all new medications as prescribed - the cream Continue all other medications as before; please have your pharmacy call for any refills you may need Please check your Blood Pressure every 2-3 days, and you should hold on taking the Coreg if the "top number" is less than 100 Please take the Fiber supplement daily Please go to LAB in the Basement for the blood and/or urine tests to be done today Please call the phone number [phone] (the PhoneTree System) for results of testing in 2-3 days;  When calling, simply dial the number, and when prompted enter the MRN number above (the Medical Record Number) and the # key, then the message should start. Please keep your appointments with your specialists as you have planned - Neurology, and Pulmonary (Dr Shelle Iron) Please call if you would also like to establish with Cardiology at [facility] (which I think would be a good idea in the long run) You had the Tetanus and Pneumonia shots today Please return in 6 months, or sooner if needed

## 2011-03-14 NOTE — Assessment & Plan Note (Signed)
Left facial, temporal - c/w dermatitis, for triam cr,  to f/u any worsening symptoms or concerns

## 2011-03-17 NOTE — Telephone Encounter (Signed)
Called patient informed daughter of UA results

## 2011-03-25 ENCOUNTER — Telehealth: Payer: Self-pay

## 2011-03-25 NOTE — Telephone Encounter (Signed)
There are no "safe" decongestants  - all similar to sudafed (pseudoephedrine) or sudafed in it (combination meds) all risk increasing the Blood Pressure  The safest route is to avoid all sudafed containing med; and consider antihistamine if allergy may be an element of the problem, or Mucinex as a "mucolytic", or other med such as Delsym (which is guafenescine) to help with cough

## 2011-03-25 NOTE — Telephone Encounter (Signed)
Pt's daughter called requesting advisement on safe OTC decongestant for pt to use with prescription medications, please advise.

## 2011-03-26 NOTE — Telephone Encounter (Signed)
Pt's daughter advised of MD's recommendation

## 2011-03-30 ENCOUNTER — Encounter: Payer: Self-pay | Admitting: Internal Medicine

## 2011-03-30 DIAGNOSIS — M5090 Cervical disc disorder, unspecified, unspecified cervical region: Secondary | ICD-10-CM | POA: Insufficient documentation

## 2011-03-30 DIAGNOSIS — E559 Vitamin D deficiency, unspecified: Secondary | ICD-10-CM | POA: Insufficient documentation

## 2011-04-22 ENCOUNTER — Other Ambulatory Visit: Payer: Self-pay

## 2011-04-22 MED ORDER — DONEPEZIL HCL 23 MG PO TABS: 23.0000 mg | ORAL_TABLET | Freq: Every day | ORAL | Status: DC

## 2011-04-23 MED ORDER — CARVEDILOL 3.125 MG PO TABS: 3.1250 mg | ORAL_TABLET | Freq: Every day | ORAL | Status: DC

## 2011-04-23 MED ORDER — ROPINIROLE HCL 1 MG PO TABS: ORAL_TABLET | ORAL | Status: DC

## 2011-05-01 ENCOUNTER — Telehealth: Payer: Self-pay

## 2011-05-01 NOTE — Telephone Encounter (Signed)
If not tried already or already taking fiber, please try metamucil daily (or similar fiber supp)

## 2011-05-01 NOTE — Telephone Encounter (Signed)
Pt's daughter called stating pt has been experiencing diarrhea on and off for several months and this was discussed at last OV with MD. Daughter states that diarrheas has return and is requesting advisement from MD.

## 2011-05-02 NOTE — Telephone Encounter (Signed)
If no fever, wt loss, abd pain, blood , she can also take immodium prn, o/w if persists, worsens, or develops the other symtpoms she may need to see GI

## 2011-05-02 NOTE — Telephone Encounter (Signed)
Informed of MD's instructions. They will call back if any changes.

## 2011-05-02 NOTE — Telephone Encounter (Signed)
Called the patient and she is already taking 10 grams daily of fiber gummies. Please advise as informed to add metamucil but she thinks may not help

## 2011-08-06 ENCOUNTER — Other Ambulatory Visit: Payer: Self-pay

## 2011-08-06 MED ORDER — CARVEDILOL 3.125 MG PO TABS: 3.1250 mg | ORAL_TABLET | Freq: Every day | ORAL | Status: DC

## 2011-09-17 ENCOUNTER — Other Ambulatory Visit (INDEPENDENT_AMBULATORY_CARE_PROVIDER_SITE_OTHER): Payer: Medicare Other

## 2011-09-17 ENCOUNTER — Encounter: Payer: Self-pay | Admitting: Internal Medicine

## 2011-09-17 ENCOUNTER — Ambulatory Visit (INDEPENDENT_AMBULATORY_CARE_PROVIDER_SITE_OTHER): Payer: Medicare Other | Admitting: Internal Medicine

## 2011-09-17 VITALS — BP 140/60 | HR 78 | Temp 97.0°F | Ht 59.0 in | Wt 102.4 lb

## 2011-09-17 DIAGNOSIS — R7302 Impaired glucose tolerance (oral): Secondary | ICD-10-CM

## 2011-09-17 DIAGNOSIS — R7309 Other abnormal glucose: Secondary | ICD-10-CM

## 2011-09-17 DIAGNOSIS — E7800 Pure hypercholesterolemia, unspecified: Secondary | ICD-10-CM

## 2011-09-17 DIAGNOSIS — M810 Age-related osteoporosis without current pathological fracture: Secondary | ICD-10-CM

## 2011-09-17 DIAGNOSIS — I10 Essential (primary) hypertension: Secondary | ICD-10-CM

## 2011-09-17 LAB — LIPID PANEL
HDL: 66.1 mg/dL (ref >39.00)
Total CHOL/HDL Ratio: 3
Triglycerides: 83 mg/dL (ref 0.0–149.0)

## 2011-09-17 LAB — BASIC METABOLIC PANEL
CO2: 30 mEq/L (ref 19–32)
Calcium: 9.9 mg/dL (ref 8.4–10.5)
Creatinine, Ser: 0.9 mg/dL (ref 0.4–1.2)
GFR: 66.22 mL/min (ref >60.00)
Sodium: 139 mEq/L (ref 135–145)

## 2011-09-17 LAB — HEMOGLOBIN A1C: Hgb A1c MFr Bld: 6.2 % (ref 4.6–6.5)

## 2011-09-17 MED ORDER — ATORVASTATIN CALCIUM 40 MG PO TABS: 40.0000 mg | ORAL_TABLET | Freq: Every day | ORAL | Status: DC

## 2011-09-17 NOTE — Patient Instructions (Signed)
OK to stop the fosamax and the lipitor 80 mg Start the lipitor 40 mg per day Please go to LAB in the Basement for the blood and/or urine tests to be done today Please call the phone number [phone] (the PhoneTree System) for results of testing in 2-3 days;  When calling, simply dial the number, and when prompted enter the MRN number above (the Medical Record Number) and the # key, then the message should start. Please schedule the bone density at the desk as you leave today If you still have significant osteoporosis, you may qualify to have Prolia Continue all other medications as before Please return in 6 months, or sooner if needed

## 2011-09-21 ENCOUNTER — Encounter: Payer: Self-pay | Admitting: Internal Medicine

## 2011-09-21 NOTE — Assessment & Plan Note (Addendum)
stable overall by hx and exam, most recent data reviewed with pt, and pt to continue medical treatment as before, except to decr the lipitor to 40 mg given the last LDL 43 last visit 6 mo ago Lab Results  Component Value Date   LDLCALC 101* 09/17/2011

## 2011-09-21 NOTE — Assessment & Plan Note (Signed)
stable overall by hx and exam, most recent data reviewed with pt, and pt to continue medical treatment as before  Lab Results  Component Value Date   HGBA1C 6.2 09/17/2011    

## 2011-09-21 NOTE — Assessment & Plan Note (Signed)
stable overall by hx and exam, most recent data reviewed with pt, and pt to continue medical treatment as before  BP Readings from Last 3 Encounters:  09/17/11 140/60  03/14/11 108/58  10/29/10 96/58

## 2011-09-21 NOTE — Assessment & Plan Note (Signed)
D/w family  - to stop the fosamax as has been > 5 yr total tx, for dxa, but would consider prolia

## 2011-09-21 NOTE — Progress Notes (Signed)
Subjective:    Patient ID: Anne Snyder, female    DOB: 1926-11-02, 78 y.o.   MRN: [id number]  HPI  Here to f/u; overall doing ok,  Pt denies chest pain, increased sob or doe, wheezing, orthopnea, PND, increased LE swelling, palpitations, dizziness or syncope.  Pt denies new neurological symptoms such as new headache, or facial or extremity weakness or numbness   Pt denies polydipsia, polyuria, or low sugar symptoms such as weakness or confusion improved with po intake.  Pt states overall good compliance with meds, trying to follow lower cholesterol, diabetic diet, wt overall stable but little exercise however.  Has been on fosamax now approx 7 yrs, due for dxa.  Denies worsening depressive symptoms, suicidal ideation, or panic, though has ongoing anxiety, not increased recently.    Pt denies fever, wt loss, night sweats, loss of appetite, or other constitutional symptoms Past Medical History  Diagnosis Date  . Sleep apnea   . Cancer     Breast malignancy  . Diverticulitis   . Heart disease   . Colon polyps   . Urine incontinence   . UTI (lower urinary tract infection)   . Impaired glucose tolerance 03/14/2011  . Arthritis   . CHF (congestive heart failure)   . GERD (gastroesophageal reflux disease)   . Kidney stones   . Hypertension   . Hyperlipidemia   . Myocardial infarct 03/14/2011  . Cervical disc disease 03/30/2011   Past Surgical History  Procedure Date  . Breast biopsy 2002  . Abdominal hysterectomy 2001  . Heart attack 12/2005 and 01/2006  . By pass surgury 05/2006  . Cataract extraction 2005  . Masectomy 09/1999  . Coronary artery bypass graft     reports that she has never smoked. She does not have any smokeless tobacco history on file. She reports that she does not drink alcohol or use illicit drugs. family history includes Arthritis in her father, mother, and other; Cancer in her mother and other; Diabetes in her father and mother; and Stroke in her father and  mother. Allergies  Allergen Reactions  . Sulfa Antibiotics    Current Outpatient Prescriptions on File Prior to Visit  Medication Sig Dispense Refill  . aspirin 325 MG tablet Take 325 mg by mouth daily.        . Calcium Carbonate (CALTRATE 600 PO) Take 1 tablet by mouth 2 (two) times daily.        . carvedilol (COREG) 3.125 MG tablet Take 1 tablet (3.125 mg total) by mouth daily. Take once daily  90 tablet  3  . donepezil (ARICEPT) 23 MG TABS tablet Take 1 tablet (23 mg total) by mouth at bedtime.  30 tablet  11  . memantine (NAMENDA) 10 MG tablet 2 (two) times daily.       . Multiple Vitamins-Minerals (EYE VITAMINS) TABS Take by mouth. Two time a day       . OMEGA-3 KRILL OIL 300 MG CAPS Take 1 capsule by mouth daily.        Marland Kitchen omeprazole (PRILOSEC) 40 MG capsule Take 40 mg by mouth daily.        Marland Kitchen rOPINIRole (REQUIP) 1 MG tablet Take once daily at bedtime  90 tablet  3  . sertraline (ZOLOFT) 100 MG tablet Take 100 mg by mouth daily.        Marland Kitchen tolterodine (DETROL LA) 4 MG 24 hr capsule Take 4 mg by mouth daily.        Marland Kitchen HYDROcodone-acetaminophen (  VICODIN) 5-500 MG per tablet Take 1 tablet by mouth every 6 (six) hours as needed.        . triamcinolone (KENALOG) 0.1 % cream Apply topically 2 (two) times daily.  30 g  0   Current Facility-Administered Medications on File Prior to Visit  Medication Dose Route Frequency Provider Last Rate Last Dose  . pneumococcal 23 valent vaccine (PNU-IMMUNE) injection 0.5 mL  0.5 mL Intramuscular Once Oliver Barre, MD      . Lady Gary Leda Min) injection 0.5 mL  0.5 mL Intramuscular Once Oliver Barre, MD       Review of Systems Review of Systems  Constitutional: Negative for diaphoresis and unexpected weight change.  HENT: Negative for drooling and tinnitus.   Eyes: Negative for photophobia and visual disturbance.  Respiratory: Negative for choking and stridor.   Gastrointestinal: Negative for vomiting and blood in stool.  Genitourinary: Negative for hematuria  and decreased urine volume. .       Objective:   Physical Exam BP 140/60  Pulse 78  Temp(Src) 97 F (36.1 C) (Oral)  Ht 4\' 11"  (1.499 m)  Wt 102 lb 6 oz (46.437 kg)  BMI 20.68 kg/m2  SpO2 97% Physical Exam  VS noted Constitutional: Pt appears well-developed and well-nourished.  HENT: Head: Normocephalic.  Right Ear: External ear normal.  Left Ear: External ear normal.  Eyes: Conjunctivae and EOM are normal. Pupils are equal, round, and reactive to light.  Neck: Normal range of motion. Neck supple.  Cardiovascular: Normal rate and regular rhythm.   Pulmonary/Chest: Effort normal and breath sounds normal.  Abd:  Soft, NT, non-distended, + BS Neurological: Pt is alert. No cranial nerve deficit.  Skin: Skin is warm. No erythema.  Psychiatric: Pt behavior is normal. not depressed affect or nervous   Assessment & Plan:

## 2011-09-29 ENCOUNTER — Other Ambulatory Visit: Payer: Medicare Other

## 2011-10-01 ENCOUNTER — Ambulatory Visit (INDEPENDENT_AMBULATORY_CARE_PROVIDER_SITE_OTHER)
Admission: RE | Admit: 2011-10-01 | Discharge: 2011-10-01 | Disposition: A | Discharge status: Home / Self Care | Payer: Medicare Other | Source: Ambulatory Visit

## 2011-10-01 DIAGNOSIS — M810 Age-related osteoporosis without current pathological fracture: Secondary | ICD-10-CM

## 2011-10-08 ENCOUNTER — Encounter: Payer: Self-pay | Admitting: Internal Medicine

## 2011-10-29 ENCOUNTER — Ambulatory Visit (INDEPENDENT_AMBULATORY_CARE_PROVIDER_SITE_OTHER): Payer: Medicare Other | Admitting: Pulmonary Disease

## 2011-10-29 ENCOUNTER — Ambulatory Visit (INDEPENDENT_AMBULATORY_CARE_PROVIDER_SITE_OTHER)
Admission: RE | Admit: 2011-10-29 | Discharge: 2011-10-29 | Disposition: A | Discharge status: Home / Self Care | Payer: Medicare Other | Source: Ambulatory Visit | Attending: Internal Medicine | Admitting: Internal Medicine

## 2011-10-29 ENCOUNTER — Encounter: Payer: Self-pay | Admitting: Internal Medicine

## 2011-10-29 ENCOUNTER — Telehealth: Payer: Self-pay | Admitting: Internal Medicine

## 2011-10-29 ENCOUNTER — Encounter: Payer: Self-pay | Admitting: Pulmonary Disease

## 2011-10-29 VITALS — BP 100/68 | HR 92 | Temp 98.0°F | Ht 59.0 in | Wt 101.0 lb

## 2011-10-29 DIAGNOSIS — G4733 Obstructive sleep apnea (adult) (pediatric): Secondary | ICD-10-CM

## 2011-10-29 DIAGNOSIS — M79609 Pain in unspecified limb: Secondary | ICD-10-CM

## 2011-10-29 DIAGNOSIS — M79671 Pain in right foot: Secondary | ICD-10-CM

## 2011-10-29 NOTE — Assessment & Plan Note (Signed)
The patient has a history of severe sleep apnea, and has been wearing CPAP compliantly by her history.  Her daughter feels that she is not as alert during the day as before, and it is unclear if this is even due to a sleep apnea issue.  I will try and get a download off her CPAP sheen to evaluate her problems, and she is also overdue for a new CPAP mask.  She states that she has had a lot of leaks lately.  I've also discussed with the daughter the possibility that her sleepiness has nothing to do with her sleep disordered breathing, but rather with her underlying neurologic issues or medications.

## 2011-10-29 NOTE — Telephone Encounter (Signed)
Patient is aware that order has been put in for xray.

## 2011-10-29 NOTE — Telephone Encounter (Signed)
Pt fell yesterday and wants right foot to have xray, pt is up seeing Dr Shelle Iron, would like done today

## 2011-10-29 NOTE — Progress Notes (Signed)
  Subjective:    Patient ID: Anne Snyder, female    DOB: 10-14-1926, 78 y.o.   MRN: [id number]  HPI The patient comes in today for followup of her known severe obstructive sleep apnea.  She has been wearing CPAP compliantly, and overall feels that it has helped her sleep and daytime alertness.  Her daughter feels that she is not as alert as she has been in the past, and the patient has been having some mask leak issues.  She rarely will pull the mask off during the night.  She also has a history of restless leg syndrome, and has been on Requip.   Review of Systems  Constitutional: Negative for fever and unexpected weight change.  HENT: Positive for ear pain, rhinorrhea and sneezing. Negative for nosebleeds, congestion, sore throat, trouble swallowing, dental problem, postnasal drip and sinus pressure.   Eyes: Negative for redness and itching.  Respiratory: Negative for cough, chest tightness, shortness of breath and wheezing.   Cardiovascular: Positive for leg swelling. Negative for palpitations.  Gastrointestinal: Negative for nausea and vomiting.  Genitourinary: Negative for dysuria.  Musculoskeletal: Negative for joint swelling.  Skin: Negative for rash.  Neurological: Negative for headaches.  Hematological: Does not bruise/bleed easily.  Psychiatric/Behavioral: Negative for dysphoric mood. The patient is not nervous/anxious.        Objective:   Physical Exam Thin female in no acute distress No skin breakdown or pressure necrosis from the CPAP mask Lower extremities without significant edema, no cyanosis Alert and oriented, moves all 4 extremities.       Assessment & Plan:

## 2011-10-29 NOTE — Patient Instructions (Signed)
Will see if we can get download data from your cpap machine, and will discuss any issues with you.  Will check to see if you can get mask cushions more frequently to prevent leaking. If doing well, followup with me in one year.

## 2011-10-29 NOTE — Telephone Encounter (Signed)
Done per emr 

## 2011-10-30 ENCOUNTER — Telehealth: Payer: Self-pay | Admitting: Internal Medicine

## 2011-10-30 DIAGNOSIS — S92909A Unspecified fracture of unspecified foot, initial encounter for closed fracture: Secondary | ICD-10-CM

## 2011-10-30 NOTE — Telephone Encounter (Signed)
Ok for stat murphy wainer referral - Done per [employer]

## 2011-11-24 ENCOUNTER — Ambulatory Visit (INDEPENDENT_AMBULATORY_CARE_PROVIDER_SITE_OTHER): Payer: Medicare Other

## 2011-11-24 DIAGNOSIS — M810 Age-related osteoporosis without current pathological fracture: Secondary | ICD-10-CM

## 2011-11-24 MED ORDER — DENOSUMAB 60 MG/ML ~~LOC~~ SOLN
60.0000 mg | Freq: Once | SUBCUTANEOUS | Status: AC
  Administered 2011-11-24: 60 mg via SUBCUTANEOUS

## 2011-11-30 ENCOUNTER — Other Ambulatory Visit: Payer: Self-pay | Admitting: Pulmonary Disease

## 2011-11-30 ENCOUNTER — Telehealth: Payer: Self-pay | Admitting: Pulmonary Disease

## 2011-11-30 DIAGNOSIS — G4733 Obstructive sleep apnea (adult) (pediatric): Secondary | ICD-10-CM

## 2011-11-30 NOTE — Telephone Encounter (Signed)
Download shows pt only wearing cpap 55/182 days for 4 or more hours at night!!  Some mask leak that is currently being addressed.  Megan, let pt know that she needs to wear this everynight as much as possible.  This may be the reason for your symptoms during the day.  I would also recommend that she be honest with her daughter so that she won't worry herself sick thinking she may have a neurologic problem.

## 2011-12-04 NOTE — Telephone Encounter (Signed)
Called and spoke with pt. Informed her of KC's recs.  Pt verbalized understanding and denied any questions. Instructed pt to call the office if she has any problems that come up regarding cpap machine, mask, etc that may hinder her from wearing it every night all night.  Pt verbalized understanding and will call us if needed.

## 2012-03-01 ENCOUNTER — Telehealth: Payer: Self-pay | Admitting: *Deleted

## 2012-03-01 MED ORDER — TOLTERODINE TARTRATE 2 MG PO TABS: 2.0000 mg | ORAL_TABLET | Freq: Two times a day (BID) | ORAL | Status: DC

## 2012-03-01 NOTE — Telephone Encounter (Signed)
Pt called to let MD know that insurance will no longer cover Detrol LA and she needs a rx sent into pharmacy for generic Detrol 2mg  BID sent to [facility].

## 2012-03-01 NOTE — Telephone Encounter (Signed)
Done per emr 

## 2012-03-01 NOTE — Telephone Encounter (Signed)
Patient informed. 

## 2012-03-22 ENCOUNTER — Ambulatory Visit (INDEPENDENT_AMBULATORY_CARE_PROVIDER_SITE_OTHER): Payer: Medicare Other | Admitting: Internal Medicine

## 2012-03-22 ENCOUNTER — Other Ambulatory Visit (INDEPENDENT_AMBULATORY_CARE_PROVIDER_SITE_OTHER): Payer: Medicare Other

## 2012-03-22 ENCOUNTER — Encounter: Payer: Self-pay | Admitting: Internal Medicine

## 2012-03-22 ENCOUNTER — Ambulatory Visit (INDEPENDENT_AMBULATORY_CARE_PROVIDER_SITE_OTHER)
Admission: RE | Admit: 2012-03-22 | Discharge: 2012-03-22 | Disposition: A | Discharge status: Home / Self Care | Payer: Medicare Other | Source: Ambulatory Visit | Attending: Internal Medicine | Admitting: Internal Medicine

## 2012-03-22 VITALS — BP 110/62 | HR 74 | Temp 97.9°F | Ht 59.0 in | Wt 104.1 lb

## 2012-03-22 DIAGNOSIS — R5383 Other fatigue: Secondary | ICD-10-CM

## 2012-03-22 DIAGNOSIS — F068 Other specified mental disorders due to known physiological condition: Secondary | ICD-10-CM

## 2012-03-22 DIAGNOSIS — I509 Heart failure, unspecified: Secondary | ICD-10-CM

## 2012-03-22 DIAGNOSIS — E559 Vitamin D deficiency, unspecified: Secondary | ICD-10-CM

## 2012-03-22 DIAGNOSIS — R7309 Other abnormal glucose: Secondary | ICD-10-CM

## 2012-03-22 DIAGNOSIS — I10 Essential (primary) hypertension: Secondary | ICD-10-CM

## 2012-03-22 DIAGNOSIS — R7302 Impaired glucose tolerance (oral): Secondary | ICD-10-CM

## 2012-03-22 DIAGNOSIS — E7800 Pure hypercholesterolemia, unspecified: Secondary | ICD-10-CM

## 2012-03-22 DIAGNOSIS — R5381 Other malaise: Secondary | ICD-10-CM

## 2012-03-22 LAB — BASIC METABOLIC PANEL
GFR: 78.49 mL/min (ref >60.00)
Potassium: 4.7 mEq/L (ref 3.5–5.1)
Sodium: 139 mEq/L (ref 135–145)

## 2012-03-22 LAB — HEPATIC FUNCTION PANEL
AST: 24 U/L (ref 0–37)
Alkaline Phosphatase: 65 U/L (ref 39–117)
Bilirubin, Direct: 0.1 mg/dL (ref 0.0–0.3)
Total Bilirubin: 0.6 mg/dL (ref 0.3–1.2)

## 2012-03-22 LAB — URINALYSIS, ROUTINE W REFLEX MICROSCOPIC
Bilirubin Urine: NEGATIVE
Ketones, ur: NEGATIVE
Leukocytes, UA: NEGATIVE
Total Protein, Urine: NEGATIVE
pH: 5.5 (ref 5.0–8.0)

## 2012-03-22 LAB — LIPID PANEL
LDL Cholesterol: 73 mg/dL (ref 0–99)
Total CHOL/HDL Ratio: 2
VLDL: 17.4 mg/dL (ref 0.0–40.0)

## 2012-03-22 LAB — CBC WITH DIFFERENTIAL/PLATELET
Eosinophils Relative: 3 % (ref 0.0–5.0)
HCT: 42.8 % (ref 36.0–46.0)
Lymphs Abs: 1.9 10*3/uL (ref 0.7–4.0)
MCHC: 33.3 g/dL (ref 30.0–36.0)
MCV: 97.6 fl (ref 78.0–100.0)
Monocytes Absolute: 0.5 10*3/uL (ref 0.1–1.0)
Platelets: 329 10*3/uL (ref 150.0–400.0)
RDW: 13.4 % (ref 11.5–14.6)
WBC: 6.7 10*3/uL (ref 4.5–10.5)

## 2012-03-22 LAB — TSH: TSH: 1.87 u[IU]/mL (ref 0.35–5.50)

## 2012-03-22 NOTE — Progress Notes (Signed)
Subjective:    Patient ID: Anne Snyder, female    DOB: [birth year], 78 y.o.   MRN: [id number]  HPI  Here to f/u; overall doing ok, but did have episode 2 days ago of marked fatigue for unclear reasons, some better in the past day or so, pt with dementia - hard to be more specific.  Pt denies chest pain, increased sob or doe, wheezing, orthopnea, PND, increased LE swelling, palpitations, dizziness or syncope.  Pt denies new neurological symptoms such as new headache, or facial or extremity weakness or numbness   Pt denies polydipsia, polyuria.   Pt denies fever, wt loss, night sweats, loss of appetite, or other constitutional symptoms  Dementia overall stable symptomatically with gradual worsening at best, and not assoc with behavioral changes such as hallucinations, paranoia, or agitation. Past Medical History  Diagnosis Date  . Sleep apnea   . Cancer     Breast malignancy  . Diverticulitis   . Heart disease   . Colon polyps   . Urine incontinence   . UTI (lower urinary tract infection)   . Impaired glucose tolerance 03/14/2011  . Arthritis   . CHF (congestive heart failure)   . GERD (gastroesophageal reflux disease)   . Kidney stones   . Hypertension   . Hyperlipidemia   . Myocardial infarct 03/14/2011  . Cervical disc disease 03/30/2011   Past Surgical History  Procedure Date  . Breast biopsy 2002  . Abdominal hysterectomy 2001  . Heart attack 12/2005 and 01/2006  . By pass surgury 05/2006  . Cataract extraction 2005  . Masectomy 09/1999  . Coronary artery bypass graft     reports that she has never smoked. She does not have any smokeless tobacco history on file. She reports that she does not drink alcohol or use illicit drugs. family history includes Arthritis in her father, mother, and other; Cancer in her mother and other; Diabetes in her father and mother; and Stroke in her father and mother. Allergies  Allergen Reactions  . Sulfa Antibiotics    Current Outpatient  Prescriptions on File Prior to Visit  Medication Sig Dispense Refill  . aspirin 325 MG tablet Take 325 mg by mouth daily.        Marland Kitchen atorvastatin (LIPITOR) 40 MG tablet Take 20 mg by mouth daily.      . Calcium Carbonate (CALTRATE 600 PO) Take 1 tablet by mouth 2 (two) times daily.        . carvedilol (COREG) 3.125 MG tablet Take 1 tablet (3.125 mg total) by mouth daily. Take once daily  90 tablet  3  . donepezil (ARICEPT) 23 MG TABS tablet Take 1 tablet (23 mg total) by mouth at bedtime.  30 tablet  11  . memantine (NAMENDA) 10 MG tablet 2 (two) times daily.       . Multiple Vitamin (MULTIVITAMIN) capsule Take 1 capsule by mouth daily.      Marland Kitchen omeprazole (PRILOSEC) 40 MG capsule Take 40 mg by mouth daily.        Marland Kitchen rOPINIRole (REQUIP) 1 MG tablet Take once daily at bedtime  90 tablet  3  . sertraline (ZOLOFT) 100 MG tablet Take 100 mg by mouth daily.        Marland Kitchen tolterodine (DETROL) 2 MG tablet Take 1 tablet (2 mg total) by mouth 2 (two) times daily.  60 tablet  11   Current Facility-Administered Medications on File Prior to Visit  Medication Dose Route Frequency Provider Last Rate Last  Dose  . pneumococcal 23 valent vaccine (PNU-IMMUNE) injection 0.5 mL  0.5 mL Intramuscular Once Corwin Levins, MD      . Lady Gary Leda Min) injection 0.5 mL  0.5 mL Intramuscular Once Corwin Levins, MD       Review of Systems Constitutional: Negative for diaphoresis and unexpected weight change.  HENT: Negative for drooling and tinnitus.   Eyes: Negative for photophobia and visual disturbance.  Respiratory: Negative for choking and stridor.   Gastrointestinal: Negative for vomiting and blood in stool.  Genitourinary: Negative for hematuria and decreased urine volume.  Musculoskeletal: Negative for acute joint swelling Skin: Negative for color change and wound.  Neurological: Negative for tremors and numbness.     Objective:   Physical Exam BP 110/62  Pulse 74  Temp 97.9 F (36.6 C) (Oral)  Ht 4\' 11"  (1.499 m)   Wt 104 lb 2 oz (47.231 kg)  BMI 21.03 kg/m2  SpO2 94% Physical Exam  VS noted Constitutional: Pt appears well-developed and well-nourished.  HENT: Head: Normocephalic.  Right Ear: External ear normal.  Left Ear: External ear normal.  Eyes: Conjunctivae and EOM are normal. Pupils are equal, round, and reactive to light.  Neck: Normal range of motion. Neck supple.  Cardiovascular: Normal rate and regular rhythm.   Pulmonary/Chest: Effort normal and breath sounds normal.  Abd:  Soft, NT, non-distended, + BS Neurological: Pt is alert. Skin: Skin is warm. No erythema.  Psychiatric: Pt behavior is normal.     Assessment & Plan:

## 2012-03-22 NOTE — Assessment & Plan Note (Signed)
With worsening balance recently, encouraged to use cane, for Head MRi as duagter states not done for > 5 yrs

## 2012-03-22 NOTE — Assessment & Plan Note (Signed)
stable overall by hx and exam, most recent data reviewed with pt, and pt to continue medical treatment as before Lab Results  Component Value Date   LDLCALC 73 03/22/2012

## 2012-03-22 NOTE — Assessment & Plan Note (Signed)
stable overall by hx and exam, most recent data reviewed with pt, and pt to continue medical treatment as before BP Readings from Last 3 Encounters:  03/22/12 110/62  10/29/11 100/68  09/17/11 140/60

## 2012-03-22 NOTE — Assessment & Plan Note (Signed)
ECG reviewed as per emr, also for cxr today given the recent fatigue, Continue all other medications as before,  to f/u any worsening symptoms or concerns

## 2012-03-22 NOTE — Patient Instructions (Addendum)
Please sign the release of information form for neurology Your EKG was OK today - and consistent with the history of heart attacks (none new) You will be contacted regarding the referral for: MRI for head  Please go to XRAY in the Basement for the x-ray test - to make sure no "sub-clinical" pneumonia or CHF Please go to LAB in the Basement for the blood and/or urine tests to be done today You will be contacted by phone if any changes need to be made immediately.  Otherwise, you will receive a letter about your results with an explanation. Please have the pharmacy call with any refills you may need., including the aricept and namenda Please return in 6 mo with Lab testing done 3-5 days before

## 2012-03-22 NOTE — Assessment & Plan Note (Signed)
stable overall by hx and exam, most recent data reviewed with pt, and pt to continue medical treatment as before Lab Results  Component Value Date   HGBA1C 6.0 03/22/2012

## 2012-03-22 NOTE — Assessment & Plan Note (Signed)
Etiology unclear, Exam otherwise benign, to check labs as documented, follow with expectant management  

## 2012-03-23 ENCOUNTER — Encounter: Payer: Self-pay | Admitting: Internal Medicine

## 2012-03-23 LAB — VITAMIN D 25 HYDROXY (VIT D DEFICIENCY, FRACTURES): Vit D, 25-Hydroxy: 40 ng/mL (ref 30–89)

## 2012-04-02 ENCOUNTER — Ambulatory Visit
Admission: RE | Admit: 2012-04-02 | Discharge: 2012-04-02 | Disposition: A | Discharge status: Home / Self Care | Payer: Medicare Other | Source: Ambulatory Visit | Attending: Internal Medicine | Admitting: Internal Medicine

## 2012-04-02 ENCOUNTER — Encounter: Payer: Self-pay | Admitting: Internal Medicine

## 2012-04-02 DIAGNOSIS — F068 Other specified mental disorders due to known physiological condition: Secondary | ICD-10-CM

## 2012-04-05 ENCOUNTER — Other Ambulatory Visit: Payer: Self-pay

## 2012-04-05 MED ORDER — SERTRALINE HCL 100 MG PO TABS: 100.0000 mg | ORAL_TABLET | Freq: Every day | ORAL | Status: DC

## 2012-06-22 ENCOUNTER — Telehealth: Payer: Self-pay | Admitting: *Deleted

## 2012-06-22 ENCOUNTER — Ambulatory Visit (INDEPENDENT_AMBULATORY_CARE_PROVIDER_SITE_OTHER): Payer: Medicare Other | Admitting: *Deleted

## 2012-06-22 DIAGNOSIS — M810 Age-related osteoporosis without current pathological fracture: Secondary | ICD-10-CM

## 2012-06-22 MED ORDER — DENOSUMAB 60 MG/ML ~~LOC~~ SOLN
60.0000 mg | Freq: Once | SUBCUTANEOUS | Status: AC
  Administered 2012-06-22: 60 mg via SUBCUTANEOUS

## 2012-06-22 NOTE — Telephone Encounter (Signed)
MD sign form gave to pt/daughter...lmb

## 2012-06-22 NOTE — Telephone Encounter (Signed)
Patient is here for prolia injection. Wanting to get a handi-cap form fill-out...lmb

## 2012-06-24 ENCOUNTER — Ambulatory Visit: Payer: Medicare Other

## 2012-07-17 ENCOUNTER — Other Ambulatory Visit: Payer: Self-pay | Admitting: Internal Medicine

## 2012-08-17 ENCOUNTER — Other Ambulatory Visit: Payer: Self-pay

## 2012-08-17 MED ORDER — DONEPEZIL HCL 23 MG PO TABS: 23.0000 mg | ORAL_TABLET | Freq: Every day | ORAL | Status: DC

## 2012-08-17 MED ORDER — ATORVASTATIN CALCIUM 40 MG PO TABS: 20.0000 mg | ORAL_TABLET | Freq: Every day | ORAL | Status: DC

## 2012-08-17 MED ORDER — ROPINIROLE HCL 1 MG PO TABS: ORAL_TABLET | ORAL | Status: DC

## 2012-08-17 MED ORDER — CARVEDILOL 3.125 MG PO TABS: 3.1250 mg | ORAL_TABLET | Freq: Every day | ORAL | Status: DC

## 2012-08-17 MED ORDER — SERTRALINE HCL 100 MG PO TABS: 100.0000 mg | ORAL_TABLET | Freq: Every day | ORAL | Status: DC

## 2012-08-17 NOTE — Telephone Encounter (Signed)
Done erx 

## 2012-08-18 ENCOUNTER — Other Ambulatory Visit: Payer: Self-pay | Admitting: Internal Medicine

## 2012-08-30 ENCOUNTER — Telehealth: Payer: Self-pay | Admitting: Internal Medicine

## 2012-08-30 MED ORDER — TOLTERODINE TARTRATE 2 MG PO TABS: 2.0000 mg | ORAL_TABLET | Freq: Two times a day (BID) | ORAL | Status: DC

## 2012-08-30 NOTE — Telephone Encounter (Signed)
Ok for refill - this is detrol

## 2012-08-30 NOTE — Telephone Encounter (Signed)
Caller left message stating time for Tolterodine( 2 mg bid) refill.  Further states that this med is not on her meds list.  Does she need to continue taking?

## 2012-08-30 NOTE — Telephone Encounter (Signed)
Ok for refill - this is detrol Solmon Ice 08/30/2012 12:41 PM Signed  Caller left message stating time for Tolterodine( 2 mg bid) refill. Further states that this med is not on her meds list. Does she need to continue taking?          Encounter MyChart Messages

## 2012-08-30 NOTE — Telephone Encounter (Signed)
rx sent in and pt. informed

## 2012-09-01 ENCOUNTER — Telehealth: Payer: Self-pay | Admitting: *Deleted

## 2012-09-01 MED ORDER — OXYBUTYNIN CHLORIDE ER 5 MG PO TB24: 5.0000 mg | ORAL_TABLET | Freq: Every day | ORAL | Status: DC

## 2012-09-01 NOTE — Telephone Encounter (Signed)
PATIENT DAUGHTER CALLED TO REQUEST MEDICATION CHANGE . Rx WAS CALL IN FOR DETROL. STATES THIS IS NOT ON FORMULARY FOR HER INSURANCE. WOULD LIKE TO REPLACE WITH OXYBUTYNIN THAT IS ON HER FORMULARY FOR HER INSURANCE. PLEASE ADVISE. CB#[phone]

## 2012-09-01 NOTE — Telephone Encounter (Signed)
Called left message to call back 

## 2012-09-01 NOTE — Telephone Encounter (Signed)
Patient informed. 

## 2012-09-01 NOTE — Telephone Encounter (Signed)
Ok to change oxybutinin ER 5 qd, sent erx to [facility]

## 2012-09-01 NOTE — Telephone Encounter (Signed)
LEFT MESSAGE ON CB# OF MEDICATION HAS BEEN CHANGED PER DR. Jonny Ruiz AND HAS BEEN SENT TO [facility], TO CALL BACK IF ANY QUESTION

## 2012-09-06 ENCOUNTER — Ambulatory Visit (INDEPENDENT_AMBULATORY_CARE_PROVIDER_SITE_OTHER): Payer: Medicare Other | Admitting: Internal Medicine

## 2012-09-06 ENCOUNTER — Telehealth: Payer: Self-pay | Admitting: Internal Medicine

## 2012-09-06 ENCOUNTER — Encounter: Payer: Self-pay | Admitting: Internal Medicine

## 2012-09-06 VITALS — BP 108/62 | HR 88 | Temp 97.3°F | Ht 59.0 in | Wt 104.2 lb

## 2012-09-06 DIAGNOSIS — F068 Other specified mental disorders due to known physiological condition: Secondary | ICD-10-CM

## 2012-09-06 DIAGNOSIS — R7302 Impaired glucose tolerance (oral): Secondary | ICD-10-CM

## 2012-09-06 DIAGNOSIS — B349 Viral infection, unspecified: Secondary | ICD-10-CM | POA: Insufficient documentation

## 2012-09-06 DIAGNOSIS — R7309 Other abnormal glucose: Secondary | ICD-10-CM

## 2012-09-06 DIAGNOSIS — B9789 Other viral agents as the cause of diseases classified elsewhere: Secondary | ICD-10-CM

## 2012-09-06 DIAGNOSIS — I10 Essential (primary) hypertension: Secondary | ICD-10-CM

## 2012-09-06 MED ORDER — OXYBUTYNIN CHLORIDE 5 MG PO TABS: 5.0000 mg | ORAL_TABLET | Freq: Two times a day (BID) | ORAL | Status: DC

## 2012-09-06 MED ORDER — ONDANSETRON HCL 4 MG PO TABS: 4.0000 mg | ORAL_TABLET | Freq: Three times a day (TID) | ORAL | Status: DC | PRN

## 2012-09-06 MED ORDER — DIPHENOXYLATE-ATROPINE 2.5-0.025 MG PO TABS: 1.0000 | ORAL_TABLET | Freq: Four times a day (QID) | ORAL | Status: DC | PRN

## 2012-09-06 MED ORDER — OSELTAMIVIR PHOSPHATE 75 MG PO CAPS: 75.0000 mg | ORAL_CAPSULE | Freq: Every day | ORAL | Status: DC

## 2012-09-06 NOTE — Assessment & Plan Note (Signed)
Acute influenza probable, for tamiflu asd, zofran/lomotil prn, tylenol, fluids,  to f/u any worsening symptoms or concerns

## 2012-09-06 NOTE — Assessment & Plan Note (Signed)
stable overall by history and exam, recent data reviewed with pt, and pt to continue medical treatment as before,  to f/u any worsening symptoms or concerns Lab Results  Component Value Date   WBC 6.7 03/22/2012   HGB 14.3 03/22/2012   HCT 42.8 03/22/2012   PLT 329.0 03/22/2012   GLUCOSE 76 03/22/2012   CHOL 169 03/22/2012   TRIG 87.0 03/22/2012   HDL 78.50 03/22/2012   LDLCALC 73 03/22/2012   ALT 17 03/22/2012   AST 24 03/22/2012   NA 139 03/22/2012   K 4.7 03/22/2012   CL 101 03/22/2012   CREATININE 0.8 03/22/2012   BUN 23 03/22/2012   CO2 32 03/22/2012   TSH 1.87 03/22/2012   HGBA1C 6.0 03/22/2012

## 2012-09-06 NOTE — Progress Notes (Signed)
Subjective:    Patient ID: Anne Snyder, female    DOB: Jul 22, 1927, 79 y.o.   MRN: [id number]  HPI here with 3-4 days acute onset flu like symptoms with HA, low grade temp, general weakness and malaise, pressure around the eyes, nasal congestion and sneezing, slight ST and nonprod cough, n/v, crampy abd pains and watery/loose stools without blood, chills, rash, joint pain, dizziness or cramps or wt loss.  Has had some appetitie loss.  Needs oxybutinin ER changed to IR due to cost.  Denies urinary symptoms such as dysuria, frequency, urgency, flank pain, hematuria.  Pt denies chest pain, increased sob or doe, wheezing, orthopnea, PND, increased LE swelling, palpitations, dizziness or syncope.  Pt denies polydipsia, polyuria.  Dementia overall stable symptomatically with gradual worsening at best, and not assoc with behavioral changes such as hallucinations, paranoia, or agitation.  Past Medical History  Diagnosis Date  . Sleep apnea   . Cancer     Breast malignancy  . Diverticulitis   . Heart disease   . Colon polyps   . Urine incontinence   . UTI (lower urinary tract infection)   . Impaired glucose tolerance 03/14/2011  . Arthritis   . CHF (congestive heart failure)   . GERD (gastroesophageal reflux disease)   . Kidney stones   . Hypertension   . Hyperlipidemia   . Myocardial infarct 03/14/2011  . Cervical disc disease 03/30/2011   Past Surgical History  Procedure Date  . Breast biopsy 2002  . Abdominal hysterectomy 2001  . Heart attack 12/2005 and 01/2006  . By pass surgury 05/2006  . Cataract extraction 2005  . Masectomy 09/1999  . Coronary artery bypass graft     reports that she has never smoked. She does not have any smokeless tobacco history on file. She reports that she does not drink alcohol or use illicit drugs. family history includes Arthritis in her father, mother, and other; Cancer in her mother and other; Diabetes in her father and mother; and Stroke in her father and  mother. Allergies  Allergen Reactions  . Sulfa Antibiotics    Current Outpatient Prescriptions on File Prior to Visit  Medication Sig Dispense Refill  . ARICEPT 23 MG TABS tablet TAKE 1 TABLET EVERYDAY AT BEDTIME  30 tablet  8  . aspirin 325 MG tablet Take 325 mg by mouth daily.        Marland Kitchen atorvastatin (LIPITOR) 40 MG tablet Take 0.5 tablets (20 mg total) by mouth daily.  90 tablet  3  . Calcium Carbonate (CALTRATE 600 PO) Take 1 tablet by mouth 2 (two) times daily.        . carvedilol (COREG) 3.125 MG tablet Take 1 tablet (3.125 mg total) by mouth daily. Take once daily  90 tablet  3  . denosumab (PROLIA) 60 MG/ML SOLN injection Inject 60 mg into the skin every 6 (six) months. Administer in upper arm, thigh, or abdomen      . memantine (NAMENDA) 10 MG tablet 2 (two) times daily.       . Multiple Vitamin (MULTIVITAMIN) capsule Take 1 capsule by mouth daily.      Marland Kitchen omeprazole (PRILOSEC) 40 MG capsule Take 40 mg by mouth daily.        Marland Kitchen rOPINIRole (REQUIP) 1 MG tablet Take once daily at bedtime  90 tablet  3  . sertraline (ZOLOFT) 100 MG tablet Take 1 tablet (100 mg total) by mouth daily.  90 tablet  3  . [DISCONTINUED] tolterodine (DETROL)  2 MG tablet Take 1 tablet (2 mg total) by mouth 2 (two) times daily.  60 tablet  11   Review of Systems  Constitutional: Negative for unexpected weight change, HENT: Negative for tinnitus.   Eyes: Negative for photophobia and visual disturbance.  Respiratory: Negative for choking and stridor.  .  Genitourinary: Negative for hematuria and decreased urine volume.  Musculoskeletal: Negative for acute joint swelling Skin: Negative for color change and wound.  Neurological: Negative for tremors and numbness other than noted  Psychiatric/Behavioral: Negative for decreased concentration or  hyperactivity.       Objective:   Physical Exam BP 108/62  Pulse 88  Temp 97.3 F (36.3 C) (Oral)  Ht 4\' 11"  (1.499 m)  Wt 104 lb 4 oz (47.287 kg)  BMI 21.06 kg/m2   SpO2 95% VS noted, mild ill Constitutional: Pt appears well-developed and well-nourished.  HENT: Head: NCAT.  Right Ear: External ear normal.  Left Ear: External ear normal. Bilat tm's with mild erythema.  Max sinus areas non tender.  Pharynx with mild erythema, no exudate  Eyes: Conjunctivae and EOM are normal. Pupils are equal, round, and reactive to light.  Neck: Normal range of motion. Neck supple.  Cardiovascular: Normal rate and regular rhythm.   Pulmonary/Chest: Effort normal and breath sounds normal.  Abd:  Soft, NT, non-distended, + BS - benign exam Neurological: Pt is alert. Not confused , motor intact, wears hearing aids Skin: Skin is warm. No erythema. No rash Psychiatric: Pt behavior is normal. Thought content normal.     Assessment & Plan:

## 2012-09-06 NOTE — Telephone Encounter (Signed)
Patient Information:  Caller Name: Liborio Nixon  Phone: [phone]  Patient: Anne Snyder, Anne Snyder  Gender: Female  DOB: 28-Dec-1926  Age: 79 Years  PCP: Oliver Barre (Adults only)  Office Follow Up:  Does the office need to follow up with this patient?: No  Instructions For The Office: N/A   Symptoms  Reason For Call & Symptoms: Vomiting  onset 1/22 once and again once on 09/06/12..  Diarrhea onset 09/06/12.  Abdominal pain intermittently.  Reviewed Health History In EMR: Yes  Reviewed Medications In EMR: Yes  Reviewed Allergies In EMR: Yes  Reviewed Surgeries / Procedures: Yes  Date of Onset of Symptoms: 09/01/2012  Treatments Tried: Ginger Ale with some improvement, bland diet.  Treatments Tried Worked: Yes  Guideline(s) Used:  Vomiting  Disposition Per Guideline:   See Today in Office  Reason For Disposition Reached:   Vomiting lasts > 48 hours  Advice Given:  N/A  Appointment Scheduled:  09/06/2012 16:15:00 Appointment Scheduled Provider:  Oliver Barre (Adults only)

## 2012-09-06 NOTE — Patient Instructions (Addendum)
Please take all new medication as prescribed - the tamiflu, with zofran for nausea, and lomotil as needed for loose stools Please continue all other medications as before, and refills have been done if requested. Your oxybutinin was sent to [facility] Thank you for enrolling in MyChart. Please follow the instructions below to securely access your online medical record. MyChart allows you to send messages to your doctor, view your test results, renew your prescriptions, schedule appointments, and more. To Log into My Chart online, please go by [employer] or [employer] to [email or web address], or download the MyChart App from the [employer] .  Your Username is: [email or web address]  (pass [email or web address]) Please send a practice Message on Mychart later today

## 2012-09-06 NOTE — Assessment & Plan Note (Signed)
BP on the lower side but asympt, Please continue all other medications as before,

## 2012-09-06 NOTE — Assessment & Plan Note (Signed)
stable overall by history and exam, recent data reviewed with pt, and pt to continue medical treatment as before,  to f/u any worsening symptoms or concerns Lab Results  Component Value Date   HGBA1C 6.0 03/22/2012

## 2012-09-09 ENCOUNTER — Other Ambulatory Visit: Payer: Self-pay

## 2012-09-09 MED ORDER — MEMANTINE HCL 10 MG PO TABS: 10.0000 mg | ORAL_TABLET | Freq: Two times a day (BID) | ORAL | Status: DC

## 2012-09-25 ENCOUNTER — Other Ambulatory Visit: Payer: Self-pay

## 2012-09-27 ENCOUNTER — Ambulatory Visit: Payer: Medicare Other | Admitting: Internal Medicine

## 2012-09-29 ENCOUNTER — Telehealth: Payer: Self-pay | Admitting: Internal Medicine

## 2012-09-29 ENCOUNTER — Encounter: Payer: Self-pay | Admitting: Internal Medicine

## 2012-09-29 MED ORDER — ATORVASTATIN CALCIUM 40 MG PO TABS: 40.0000 mg | ORAL_TABLET | Freq: Every day | ORAL | Status: DC

## 2012-09-29 MED ORDER — DONEPEZIL HCL 23 MG PO TABS: 23.0000 mg | ORAL_TABLET | Freq: Every day | ORAL | Status: DC

## 2012-09-29 NOTE — Telephone Encounter (Signed)
Zella Ball to let pt daughter know as well, that the brand name aricept was sent

## 2012-09-29 NOTE — Telephone Encounter (Signed)
Patients daughter informed

## 2012-09-29 NOTE — Telephone Encounter (Signed)
Called left message to call back 

## 2012-09-29 NOTE — Telephone Encounter (Signed)
Zella Ball to let daughter know, lipitor changed back to 40 mg, appears to have been a mistake on the rx  New rx sent to [facility]

## 2012-10-06 ENCOUNTER — Encounter: Payer: Self-pay | Admitting: Internal Medicine

## 2012-10-06 ENCOUNTER — Ambulatory Visit (INDEPENDENT_AMBULATORY_CARE_PROVIDER_SITE_OTHER): Payer: 59 | Admitting: Internal Medicine

## 2012-10-06 ENCOUNTER — Other Ambulatory Visit (INDEPENDENT_AMBULATORY_CARE_PROVIDER_SITE_OTHER): Payer: 59

## 2012-10-06 ENCOUNTER — Telehealth: Payer: Self-pay | Admitting: Internal Medicine

## 2012-10-06 VITALS — BP 112/68 | HR 77 | Temp 97.2°F | Wt 106.8 lb

## 2012-10-06 DIAGNOSIS — I2510 Atherosclerotic heart disease of native coronary artery without angina pectoris: Secondary | ICD-10-CM

## 2012-10-06 DIAGNOSIS — F068 Other specified mental disorders due to known physiological condition: Secondary | ICD-10-CM

## 2012-10-06 DIAGNOSIS — R42 Dizziness and giddiness: Secondary | ICD-10-CM

## 2012-10-06 DIAGNOSIS — I428 Other cardiomyopathies: Secondary | ICD-10-CM

## 2012-10-06 LAB — CBC WITH DIFFERENTIAL/PLATELET
Basophils Absolute: 0.1 10*3/uL (ref 0.0–0.1)
Eosinophils Relative: 1 % (ref 0.0–5.0)
HCT: 39.6 % (ref 36.0–46.0)
Hemoglobin: 13.4 g/dL (ref 12.0–15.0)
Lymphs Abs: 1.2 10*3/uL (ref 0.7–4.0)
MCV: 96.1 fl (ref 78.0–100.0)
Monocytes Absolute: 0.3 10*3/uL (ref 0.1–1.0)
Monocytes Relative: 6.1 % (ref 3.0–12.0)
Neutro Abs: 3.6 10*3/uL (ref 1.4–7.7)
Platelets: 339 10*3/uL (ref 150.0–400.0)
RDW: 13.6 % (ref 11.5–14.6)

## 2012-10-06 LAB — HEPATIC FUNCTION PANEL
ALT: 14 U/L (ref 0–35)
Bilirubin, Direct: 0.1 mg/dL (ref 0.0–0.3)
Total Bilirubin: 0.5 mg/dL (ref 0.3–1.2)

## 2012-10-06 LAB — BASIC METABOLIC PANEL
BUN: 10 mg/dL (ref 6–23)
CO2: 25 mEq/L (ref 19–32)
Calcium: 9.3 mg/dL (ref 8.4–10.5)
Chloride: 102 mEq/L (ref 96–112)
Creatinine, Ser: 0.8 mg/dL (ref 0.4–1.2)
GFR: 76.05 mL/min (ref >60.00)
Glucose, Bld: 124 mg/dL — ABNORMAL HIGH (ref 70–99)
Potassium: 4 mEq/L (ref 3.5–5.1)
Sodium: 135 mEq/L (ref 135–145)

## 2012-10-06 LAB — TSH: TSH: 1.54 u[IU]/mL (ref 0.35–5.50)

## 2012-10-06 NOTE — Telephone Encounter (Signed)
Patient Information:  Caller Name: Liborio Nixon  Phone: [phone]  Patient: Anne Snyder, Anne Snyder  Gender: Female  DOB: 1927-05-28  Age: 79 Years  PCP: Oliver Barre (Adults only)  Office Follow Up:  Does the office need to follow up with this patient?: No  Instructions For The Office: N/A  RN Note:  Noted  dizziness and not feeling well this morning after getting out of bed to void.  Onset: 10/06/12 at 0730.  Initial BP 167/95 in right arm at 0730.Follow up BP's: 159/70. RN daughter had difficulty finding pulse. Radial pulse 48 bpm and "thready." Repeat carotid pulses were 76 and 72 bpm at 0938.  Ambulatory.  Currently eating breakfast' minimal dizziness when asked.  No appointments remain with Dr Jonny Ruiz until late afternoon so scheduled for next open spot with any provider.   Symptoms  Reason For Call & Symptoms: Woke up feeling dizzy.  BP 167/95, 159/70 at 0915 in right arm  Reviewed Health History In EMR: Yes  Reviewed Medications In EMR: Yes  Reviewed Allergies In EMR: Yes  Reviewed Surgeries / Procedures: Yes  Date of Onset of Symptoms: 10/06/2012  Guideline(s) Used:  Dizziness  Disposition Per Guideline:   Go to Office Now  Reason For Disposition Reached:   Lightheadedness (dizziness) present now, after 2 hours of rest and fluids  Advice Given:  Drink Fluids:  Drink several glasses of fruit juice, other clear fluids, or water. This will improve hydration and blood glucose. If you have a fever or have had heat exposure, make sure the fluids are cold.  Stand Up Slowly:  In the mornings, sit up for a few minutes before you stand up. That will help your blood flow make the adjustment.  Sit down or lie down if you feel dizzy.  Call Back If:  Passes out (faints)  You become worse.  Appointment Scheduled:  10/06/2012 10:00:00 Appointment Scheduled Provider:  Rene Paci (Adults only)

## 2012-10-06 NOTE — Progress Notes (Signed)
Subjective:    Patient ID: Anne Snyder, female    DOB: 1926/11/10, 79 y.o.   MRN: [id number]  HPI  Repots episode of dizziness this AM sudden onset after using BR - not present when getting out of bed Lasted approx 2h, gradually faded away and feels well Denies headache, visual change, weakness, nausea No chest pain, palpitations, shortness of breath or syncope No history of same, takes beta blocker for cardiac protection but never on treatment for high blood pressure  Past Medical History  Diagnosis Date  . Sleep apnea   . Cancer     Breast malignancy  . Diverticulitis   . Heart disease   . Colon polyps   . Urine incontinence   . UTI (lower urinary tract infection)   . Impaired glucose tolerance 03/14/2011  . Arthritis   . CHF (congestive heart failure)   . GERD (gastroesophageal reflux disease)   . Kidney stones   . Hypertension   . Hyperlipidemia   . Myocardial infarct 03/14/2011  . Cervical disc disease    Review of Systems  Constitutional: Negative for fever, fatigue and unexpected weight change.  HENT: Negative for hearing loss, congestion, rhinorrhea, neck pain, postnasal drip and sinus pressure.   Eyes: Negative for pain and visual disturbance.  Respiratory: Negative for cough and shortness of breath.   Cardiovascular: Negative for chest pain, palpitations and leg swelling.  Neurological: Negative for facial asymmetry, speech difficulty, light-headedness, numbness and headaches.       Objective:   Physical Exam BP 152/70  Pulse 77  Temp(Src) 97.2 F (36.2 C) (Oral)  Wt 106 lb 12.8 oz (48.444 kg)  BMI 21.56 kg/m2  SpO2 97% Wt Readings from Last 3 Encounters:  10/06/12 106 lb 12.8 oz (48.444 kg)  09/06/12 104 lb 4 oz (47.287 kg)  03/22/12 104 lb 2 oz (47.231 kg)   Constitutional: She appears well-developed and well-nourished. No distress. dtr at side HENT: Head: Normocephalic and atraumatic. Ears: B TMs ok, no erythema or effusion; Nose: Nose normal.  Mouth/Throat: Oropharynx is clear and moist. No oropharyngeal exudate.  Eyes: Conjunctivae and EOM are normal. Pupils are equal, round, and reactive to light. No scleral icterus.  Neck: Normal range of motion. Neck supple. No JVD or LAD, no carotid bruit present. No thyromegaly present.  Cardiovascular: Normal rate, regular rhythm and normal heart sounds.  No murmur heard. No BLE edema. Pulmonary/Chest: Effort normal and breath sounds normal. No respiratory distress. She has no wheezes.  Abdominal: Soft. Bowel sounds are normal. She exhibits no distension. There is no tenderness. no masses Musculoskeletal: Normal range of motion, no joint effusions. No gross deformities Neurological: She is alert and oriented to person, place, and time. No cranial nerve deficit. Coordination, speech, recall and balance/gait are normal.  Skin: Skin is warm and dry. No rash noted. No erythema.  Psychiatric: She has a normal mood and affect. Her behavior is normal. Judgment and thought content normal.   Lab Results  Component Value Date   WBC 6.7 03/22/2012   HGB 14.3 03/22/2012   HCT 42.8 03/22/2012   PLT 329.0 03/22/2012   GLUCOSE 76 03/22/2012   CHOL 169 03/22/2012   TRIG 87.0 03/22/2012   HDL 78.50 03/22/2012   LDLCALC 73 03/22/2012   ALT 17 03/22/2012   AST 24 03/22/2012   NA 139 03/22/2012   K 4.7 03/22/2012   CL 101 03/22/2012   CREATININE 0.8 03/22/2012   BUN 23 03/22/2012   CO2 32 03/22/2012  TSH 1.87 03/22/2012   HGBA1C 6.0 03/22/2012       Assessment & Plan:   Dizziness x 2 h, now resolved associated with marked BP elevation -but no prior hx HTN Currently asymptomatic and has resumed normotensive state No evidence of recent viral URI or labyrinthitis Concerning for cardiac or cerebral ischemic event, especially given history of same  Coronary artery disease, status post CABG in 2007 -no specific anginal symptoms Dementia, stable on medications  Check screening labs to rule out underlying metabolic  abnormality  Discussed peripheral vascular testing and ischemic testing with bilateral carotids and cardiac stress test -patient and daughter adamantly note they would not be interested in invasive testing or intervention if abnormality were identified. Explained we may alter medication management of ischemic risk factors if abnormal test results as episode of dizziness may have represented TIA or anginal equivalent. Patient and daughter agree to same. We'll also refer to cardiology for review of same and guidance on medical therapy as indicated  Long discussion with patient and daughter regarding CODE STATUS. Patient has living will but has apparently been copied into records are not readily available. Patient adamantly states she would not want any invasive or heroic or life prolonging measures including artificial ventilation, resuscitation or feeding. To that end, a DO NOT RESUSCITATE order has been placed in her chart according to her wishes. Daughter present and appreciates these efforts  Time spent with pt/family today 40 minutes, greater than 50% time spent counseling patient on Transient symptomatic dizziness with hypertension, CODE STATUS and medication review. Also review of prior records

## 2012-10-06 NOTE — Patient Instructions (Signed)
It was good to see you today. We have reviewed your prior records including labs and tests today we'll make referral to [facility] cardiology and for carotid and stress testing . Our office will contact you regarding appointment(s) once made. Medications reviewed, no changes at this time.

## 2012-10-18 ENCOUNTER — Encounter: Payer: Self-pay | Admitting: Internal Medicine

## 2012-10-18 ENCOUNTER — Ambulatory Visit (HOSPITAL_COMMUNITY): Payer: Medicare Other | Attending: Cardiology

## 2012-10-18 ENCOUNTER — Other Ambulatory Visit (HOSPITAL_COMMUNITY): Payer: Self-pay | Admitting: *Deleted

## 2012-10-18 DIAGNOSIS — I2510 Atherosclerotic heart disease of native coronary artery without angina pectoris: Secondary | ICD-10-CM

## 2012-10-18 DIAGNOSIS — R42 Dizziness and giddiness: Secondary | ICD-10-CM | POA: Insufficient documentation

## 2012-10-18 DIAGNOSIS — I428 Other cardiomyopathies: Secondary | ICD-10-CM | POA: Insufficient documentation

## 2012-10-18 MED ORDER — SODIUM CHLORIDE 0.9 % IV SOLN
30.0000 ug/kg | INTRAVENOUS | Status: DC
  Administered 2012-10-18: 30 ug/kg/min via INTRAVENOUS

## 2012-10-18 MED ORDER — PERFLUTREN PROTEIN A MICROSPH IV SUSP
0.5000 mL | Freq: Once | INTRAVENOUS | Status: AC
  Administered 2012-10-18: 3 mL via INTRAVENOUS

## 2012-10-18 NOTE — Progress Notes (Signed)
Dobutamine  Stress Echocardiogram performed with [facility].

## 2012-10-20 ENCOUNTER — Encounter (INDEPENDENT_AMBULATORY_CARE_PROVIDER_SITE_OTHER): Payer: Medicare Other

## 2012-10-20 DIAGNOSIS — R42 Dizziness and giddiness: Secondary | ICD-10-CM

## 2012-10-20 DIAGNOSIS — I6529 Occlusion and stenosis of unspecified carotid artery: Secondary | ICD-10-CM

## 2012-10-27 ENCOUNTER — Ambulatory Visit: Payer: Medicare Other | Admitting: Pulmonary Disease

## 2012-11-05 ENCOUNTER — Encounter: Payer: Self-pay | Admitting: Cardiovascular Disease

## 2012-11-05 ENCOUNTER — Ambulatory Visit (INDEPENDENT_AMBULATORY_CARE_PROVIDER_SITE_OTHER): Payer: Medicare Other | Admitting: Cardiovascular Disease

## 2012-11-05 VITALS — BP 120/62 | HR 70 | Wt 109.0 lb

## 2012-11-05 DIAGNOSIS — I509 Heart failure, unspecified: Secondary | ICD-10-CM

## 2012-11-05 DIAGNOSIS — F068 Other specified mental disorders due to known physiological condition: Secondary | ICD-10-CM

## 2012-11-05 DIAGNOSIS — I2510 Atherosclerotic heart disease of native coronary artery without angina pectoris: Secondary | ICD-10-CM

## 2012-11-05 NOTE — Assessment & Plan Note (Signed)
Asymptomatic wit low risk dobutamine echo 3/14  Continue medical Rx especially considering age and dementia

## 2012-11-05 NOTE — Assessment & Plan Note (Signed)
Euvolemic with no signs of volume overload EF not that bad 45% by echo Continue medical Rx

## 2012-11-05 NOTE — Assessment & Plan Note (Addendum)
F/U primary on aricept with no excessive bradycardia

## 2012-11-05 NOTE — Progress Notes (Signed)
Patient ID: Anne Snyder, female   DOB: 06/29/1927, 79 y.o.   MRN: [id number] 79 yo with CAD and HTN.  Previously seen by cardiologist in [place].  CABG in [place] 2007 after 2 MI"s.  Significant dementia Lives with daughter who seems a bit stressed about caring for mom.  No chest pain.  Reviewed dobutamine echo done 10/18/12  Baseline RWMA;s from old MI's and EF 45%  With dobutamine no new RWMAls  Patient not having any SSCP.  Also has bilateral 40-59% carotid disease. No TIA or stroke symptoms.  Does ADL's but needs supervision and cannot drive.  Daughter helps with meds  ROS: Denies fever, malais, weight loss, blurry vision, decreased visual acuity, cough, sputum, SOB, hemoptysis, pleuritic pain, palpitaitons, heartburn, abdominal pain, melena, lower extremity edema, claudication, or rash.  All other systems reviewed and negative   General: Affect appropriate Frail elderly female HEENT: normal Neck supple with no adenopathy JVP normal no bruits no thyromegaly Lungs clear with no wheezing and good diaphragmatic motion Heart:  S1/S2 soft systolic murmur,rub, gallop or click PMI normal Abdomen: benighn, BS positve, no tenderness, no AAA no bruit.  No HSM or HJR Distal pulses intact with no bruits No edema Neuro non-focal Skin warm and dry No muscular weakness  Medications Current Outpatient Prescriptions  Medication Sig Dispense Refill  . aspirin 325 MG tablet Take 325 mg by mouth daily.        Marland Kitchen atorvastatin (LIPITOR) 40 MG tablet Take 1 tablet (40 mg total) by mouth daily.  90 tablet  3  . Calcium Carbonate (CALTRATE 600 PO) Take 1 tablet by mouth 2 (two) times daily.        . carvedilol (COREG) 3.125 MG tablet Take 1 tablet (3.125 mg total) by mouth daily. Take once daily  90 tablet  3  . denosumab (PROLIA) 60 MG/ML SOLN injection Inject 60 mg into the skin every 6 (six) months. Administer in upper arm, thigh, or abdomen      . diphenoxylate-atropine (LOMOTIL) 2.5-0.025 MG per  tablet Take 1 tablet by mouth 4 (four) times daily as needed for diarrhea or loose stools.  40 tablet  0  . donepezil (ARICEPT) 23 MG TABS tablet Take 1 tablet (23 mg total) by mouth daily.  90 tablet  3  . memantine (NAMENDA) 10 MG tablet Take 1 tablet (10 mg total) by mouth 2 (two) times daily.  180 tablet  3  . Multiple Vitamin (MULTIVITAMIN) capsule Take 1 capsule by mouth daily.      Marland Kitchen omeprazole (PRILOSEC) 40 MG capsule Take 40 mg by mouth daily.        . ondansetron (ZOFRAN) 4 MG tablet Take 1 tablet (4 mg total) by mouth every 8 (eight) hours as needed for nausea.  40 tablet  0  . oxybutynin (DITROPAN) 5 MG tablet Take 1 tablet (5 mg total) by mouth 2 (two) times daily.  180 tablet  3  . rOPINIRole (REQUIP) 1 MG tablet Take once daily at bedtime  90 tablet  3  . sertraline (ZOLOFT) 100 MG tablet Take 1 tablet (100 mg total) by mouth daily.  90 tablet  3  . [DISCONTINUED] tolterodine (DETROL) 2 MG tablet Take 1 tablet (2 mg total) by mouth 2 (two) times daily.  60 tablet  11   No current facility-administered medications for this visit.   Facility-Administered Medications Ordered in Other Visits  Medication Dose Route Frequency Provider Last Rate Last Dose  . DOBUTamine (DOBUTREX) 1,000 mcg/mL  in sodium chloride 0.9 % 150 mL infusion  30 mcg/kg Intravenous Continuous Newt Lukes, MD 87.1 mL/hr at 10/18/12 1619 30 mcg/kg/min at 10/18/12 1619    Allergies Sulfa antibiotics  Family History: Family History  Problem Relation Age of Onset  . Arthritis Mother   . Cancer Mother     breast cancer  . Stroke Mother   . Diabetes Mother   . Arthritis Father   . Stroke Father   . Diabetes Father   . Arthritis Other   . Cancer Other     breast and lung cancer    Social History: History   Social History  . Marital Status: Divorced    Spouse Name: N/A    Number of Children: N/A  . Years of Education: N/A   Occupational History  . retired [employer]   Social History Main  Topics  . Smoking status: Never Smoker   . Smokeless tobacco: Not on file  . Alcohol Use: No  . Drug Use: No  . Sexually Active: Not on file   Other Topics Concern  . Not on file   Social History Narrative  . No narrative on file    Electrocardiogram:  03/22/12  SR rate 67 LVH LAD old anteroseptal mi  Assessment and Plan

## 2012-11-05 NOTE — Patient Instructions (Signed)
Your physician wants you to follow-up in: YEAR WITH DR NISHAN  You will receive a reminder letter in the mail two months in advance. If you don't receive a letter, please call our office to schedule the follow-up appointment.  Your physician recommends that you continue on your current medications as directed. Please refer to the Current Medication list given to you today. 

## 2012-11-15 ENCOUNTER — Ambulatory Visit (INDEPENDENT_AMBULATORY_CARE_PROVIDER_SITE_OTHER): Payer: Medicare Other | Admitting: Pulmonary Disease

## 2012-11-15 ENCOUNTER — Ambulatory Visit (HOSPITAL_BASED_OUTPATIENT_CLINIC_OR_DEPARTMENT_OTHER): Payer: Medicare Other | Attending: Pulmonary Disease | Admitting: Radiology

## 2012-11-15 ENCOUNTER — Encounter: Payer: Self-pay | Admitting: Pulmonary Disease

## 2012-11-15 VITALS — BP 118/78 | HR 97 | Temp 97.6°F | Ht 59.0 in | Wt 108.4 lb

## 2012-11-15 DIAGNOSIS — G4733 Obstructive sleep apnea (adult) (pediatric): Secondary | ICD-10-CM

## 2012-11-15 DIAGNOSIS — Z9989 Dependence on other enabling machines and devices: Secondary | ICD-10-CM

## 2012-11-15 NOTE — Patient Instructions (Addendum)
Will have your mask fitted at the sleep center during the day. Once this is done, would like [employer] to put your machine on a pressure of 14, and get a download for me in 4 weeks. Will contact you once I get your download.  If you do not hear from me in a few weeks after your device is download, call to let us know. Schedule a followup apptm with me in one year, but we will be in touch.

## 2012-11-15 NOTE — Progress Notes (Signed)
  Subjective:    Patient ID: Anne Snyder, female    DOB: Jul 10, 1927, 79 y.o.   MRN: [id number]  HPI Patient comes in today for followup of her known obstructive sleep apnea.  She is having issues with her CPAP mask fit, and is unable to get the hose to step onto the mask.  There appears to be a piece missing.  Her download today shows continued poor compliance, with significant mask leak.  The patient feels that she is not sleeping much better with the CPAP, and denies improved alertness during the day.  However, daughter states that she is clearly better than before in terms of daytime alertness.     Review of Systems  Constitutional: Negative for fever and unexpected weight change.  HENT: Positive for rhinorrhea and sneezing. Negative for ear pain, nosebleeds, congestion, sore throat, trouble swallowing, dental problem, postnasal drip and sinus pressure.   Eyes: Negative for redness and itching.  Respiratory: Negative for cough, chest tightness, shortness of breath and wheezing.   Cardiovascular: Negative for palpitations and leg swelling.  Gastrointestinal: Negative for nausea and vomiting.  Genitourinary: Negative for dysuria.  Musculoskeletal: Negative for joint swelling.  Skin: Negative for rash.  Neurological: Negative for headaches.  Hematological: Does not bruise/bleed easily.  Psychiatric/Behavioral: Negative for dysphoric mood. The patient is not nervous/anxious.        Objective:   Physical Exam Thin female in no acute distress Nose without purulence or discharge noted No skin breakdown or pressure necrosis from the CPAP mask Neck without lymphadenopathy or thyromegaly Lower extremities with no significant edema, no cyanosis Awake, but does appear somewhat sleepy, moves all 4 extremities.       Assessment & Plan:

## 2012-11-15 NOTE — Assessment & Plan Note (Signed)
The patient does not feel CPAP is helping her that much at this time, but the daughter feels that it is clearly helped her daytime alertness.  Part of the problem is that her compliance is less than 50%, and she is having significant mask leak.  At this point, I would like to have her get a formal mask fitting at the sleep Center, and would also like to turn down her pressure to see if her tolerance improves.  We'll get a download off her device in 4 weeks to verify.

## 2012-12-01 ENCOUNTER — Telehealth: Payer: Self-pay

## 2012-12-01 NOTE — Telephone Encounter (Signed)
Thanks for calling, you should be every 6 mo, and I am not sure why this has not been scheduled;  Will have robin let pt know we will ask ruby mcclinton for help with this

## 2012-12-01 NOTE — Telephone Encounter (Signed)
Pt called requesting advisement on her next Prolia injections, last injection was 06/22/2012.

## 2012-12-02 NOTE — Telephone Encounter (Signed)
Called left message to call back 

## 2012-12-02 NOTE — Telephone Encounter (Signed)
Patient informed. 

## 2012-12-02 NOTE — Telephone Encounter (Signed)
Ok for [employer] claritin (or off brand), as well as now there is OTC Nasacort she could try as well

## 2012-12-02 NOTE — Telephone Encounter (Signed)
I forwarded note to Ruby since she takes care of Prolia.  Ruby has spoken to the patient and did state the patient would like advise on what to take for allergies?

## 2012-12-31 ENCOUNTER — Ambulatory Visit (INDEPENDENT_AMBULATORY_CARE_PROVIDER_SITE_OTHER): Payer: Medicare Other | Admitting: Internal Medicine

## 2012-12-31 ENCOUNTER — Other Ambulatory Visit (INDEPENDENT_AMBULATORY_CARE_PROVIDER_SITE_OTHER): Payer: Medicare Other

## 2012-12-31 ENCOUNTER — Encounter: Payer: Self-pay | Admitting: Internal Medicine

## 2012-12-31 VITALS — BP 102/62 | HR 100 | Temp 97.3°F | Ht 59.0 in | Wt 105.0 lb

## 2012-12-31 DIAGNOSIS — H9193 Unspecified hearing loss, bilateral: Secondary | ICD-10-CM | POA: Insufficient documentation

## 2012-12-31 DIAGNOSIS — E7800 Pure hypercholesterolemia, unspecified: Secondary | ICD-10-CM

## 2012-12-31 DIAGNOSIS — Z0000 Encounter for general adult medical examination without abnormal findings: Secondary | ICD-10-CM

## 2012-12-31 DIAGNOSIS — H9190 Unspecified hearing loss, unspecified ear: Secondary | ICD-10-CM

## 2012-12-31 DIAGNOSIS — M810 Age-related osteoporosis without current pathological fracture: Secondary | ICD-10-CM

## 2012-12-31 DIAGNOSIS — I2510 Atherosclerotic heart disease of native coronary artery without angina pectoris: Secondary | ICD-10-CM

## 2012-12-31 DIAGNOSIS — I10 Essential (primary) hypertension: Secondary | ICD-10-CM

## 2012-12-31 LAB — URINALYSIS, ROUTINE W REFLEX MICROSCOPIC
Bilirubin Urine: NEGATIVE
Ketones, ur: NEGATIVE
Leukocytes, UA: NEGATIVE
pH: 5.5 (ref 5.0–8.0)

## 2012-12-31 LAB — LIPID PANEL
HDL: 60.1 mg/dL (ref >39.00)
Total CHOL/HDL Ratio: 3
Triglycerides: 127 mg/dL (ref 0.0–149.0)
VLDL: 25.4 mg/dL (ref 0.0–40.0)

## 2012-12-31 MED ORDER — DENOSUMAB 60 MG/ML ~~LOC~~ SOLN
60.0000 mg | Freq: Once | SUBCUTANEOUS | Status: AC
  Administered 2012-12-31: 60 mg via SUBCUTANEOUS

## 2012-12-31 MED ORDER — ATORVASTATIN CALCIUM 40 MG PO TABS: 20.0000 mg | ORAL_TABLET | Freq: Every day | ORAL | Status: DC

## 2012-12-31 NOTE — Progress Notes (Signed)
Subjective:    Patient ID: Anne Snyder, female    DOB: 12/12/1926, 79 y.o.   MRN: [id number]  HPI Here for wellness and f/u with daughter;  Overall doing ok;  Pt denies CP, worsening SOB, DOE, wheezing, orthopnea, PND, worsening LE edema, palpitations, dizziness or syncope.  Pt denies neurological change such as new headache, facial or extremity weakness.  Pt denies polydipsia, polyuria, or low sugar symptoms. Pt states overall good compliance with treatment and medications, good tolerability, and has been trying to follow lower cholesterol diet.  Pt denies worsening depressive symptoms, suicidal ideation or panic. No fever, night sweats, wt loss, loss of appetite, or other constitutional symptoms.  Pt states fair ability with ADL's, has mod fall risk, home safety reviewed and adequate, no other significant changes in hearing or vision, and only occasionally active with exercise. Due for prolia today Past Medical History  Diagnosis Date  . Sleep apnea   . Cancer     Breast malignancy  . Diverticulitis   . Heart disease   . Colon polyps   . Urine incontinence   . UTI (lower urinary tract infection)   . Impaired glucose tolerance 03/14/2011  . Arthritis   . CHF (congestive heart failure)   . GERD (gastroesophageal reflux disease)   . Kidney stones   . Hypertension   . Hyperlipidemia   . Myocardial infarct 03/14/2011  . Cervical disc disease    Past Surgical History  Procedure Laterality Date  . Breast biopsy  2002  . Abdominal hysterectomy  2001  . Heart attack  12/2005 and 01/2006  . By pass surgury  05/2006  . Cataract extraction  2005  . Masectomy  09/1999  . Coronary artery bypass graft      reports that she has never smoked. She does not have any smokeless tobacco history on file. She reports that she does not drink alcohol or use illicit drugs. family history includes Arthritis in her father, mother, and other; Cancer in her mother and other; Diabetes in her father and mother;  and Stroke in her father and mother. Allergies  Allergen Reactions  . Sulfa Antibiotics    Current Outpatient Prescriptions on File Prior to Visit  Medication Sig Dispense Refill  . aspirin 325 MG tablet Take 325 mg by mouth daily.        . Calcium Carbonate (CALTRATE 600 PO) Take 1 tablet by mouth 2 (two) times daily.        . carvedilol (COREG) 3.125 MG tablet Take 1 tablet (3.125 mg total) by mouth daily. Take once daily  90 tablet  3  . denosumab (PROLIA) 60 MG/ML SOLN injection Inject 60 mg into the skin every 6 (six) months. Administer in upper arm, thigh, or abdomen      . diphenoxylate-atropine (LOMOTIL) 2.5-0.025 MG per tablet Take 1 tablet by mouth 4 (four) times daily as needed for diarrhea or loose stools.  40 tablet  0  . donepezil (ARICEPT) 23 MG TABS tablet Take 1 tablet (23 mg total) by mouth daily.  90 tablet  3  . memantine (NAMENDA) 10 MG tablet Take 1 tablet (10 mg total) by mouth 2 (two) times daily.  180 tablet  3  . Multiple Vitamin (MULTIVITAMIN) capsule Take 1 capsule by mouth daily.      Marland Kitchen omeprazole (PRILOSEC) 40 MG capsule Take 40 mg by mouth daily.        Marland Kitchen oxybutynin (DITROPAN) 5 MG tablet Take 1 tablet (5 mg total)  by mouth 2 (two) times daily.  180 tablet  3  . rOPINIRole (REQUIP) 1 MG tablet Take once daily at bedtime  90 tablet  3  . sertraline (ZOLOFT) 100 MG tablet Take 1 tablet (100 mg total) by mouth daily.  90 tablet  3  . [DISCONTINUED] tolterodine (DETROL) 2 MG tablet Take 1 tablet (2 mg total) by mouth 2 (two) times daily.  60 tablet  11   Current Facility-Administered Medications on File Prior to Visit  Medication Dose Route Frequency Provider Last Rate Last Dose  . DOBUTamine (DOBUTREX) 1,000 mcg/mL in sodium chloride 0.9 % 150 mL infusion  30 mcg/kg Intravenous Continuous Newt Lukes, MD 87.1 mL/hr at 10/18/12 1619 30 mcg/kg/min at 10/18/12 1619   Review of Systems Constitutional: Negative for diaphoresis, activity change, appetite change  or unexpected weight change.  HENT: Negative for hearing loss, ear pain, facial swelling, mouth sores and neck stiffness.   Eyes: Negative for pain, redness and visual disturbance.  Respiratory: Negative for shortness of breath and wheezing.   Cardiovascular: Negative for chest pain and palpitations.  Gastrointestinal: Negative for diarrhea, blood in stool, abdominal distention or other pain Genitourinary: Negative for hematuria, flank pain or change in urine volume.  Musculoskeletal: Negative for myalgias and joint swelling.  Skin: Negative for color change and wound.  Neurological: Negative for syncope and numbness. other than noted Hematological: Negative for adenopathy.  Psychiatric/Behavioral: Negative for hallucinations, self-injury, decreased concentration and agitation.      Objective:   Physical Exam BP 102/62  Pulse 100  Temp(Src) 97.3 F (36.3 C) (Oral)  Ht 4\' 11"  (1.499 m)  Wt 105 lb (47.628 kg)  BMI 21.2 kg/m2  SpO2 97% VS noted,  Constitutional: Pt is oriented to person, place, and time. Appears frail, walks with walker Head: Normocephalic and atraumatic.  Right Ear: External ear normal.  Left Ear: External ear normal.  Nose: Nose normal.  Mouth/Throat: Oropharynx is clear and moist.  Eyes: Conjunctivae and EOM are normal. Pupils are equal, round, and reactive to light.  Neck: Normal range of motion. Neck supple. No JVD present. No tracheal deviation present.  Cardiovascular: Normal rate, regular rhythm, normal heart sounds and intact distal pulses.   Pulmonary/Chest: Effort normal and breath sounds normal.  Abdominal: Soft. Bowel sounds are normal. There is no tenderness. No HSM  Musculoskeletal: Normal range of motion. Exhibits no edema.  Lymphadenopathy:  Has no cervical adenopathy.  Neurological: Pt is alert and oriented to person, place, and time. Pt has normal reflexes. No cranial nerve deficit. Except bilat hearing loss - due for hearing eval f/u Skin: Skin  is warm and dry. No rash noted.  Psychiatric:  Has  normal mood and affect. Thought c/w severe dementia     Assessment & Plan:

## 2012-12-31 NOTE — Patient Instructions (Signed)
Please continue all other medications as before, and refills have been done if requested. Please have the pharmacy call with any other refills you may need. Please go to the LAB in the Basement (turn left off the elevator) for the tests to be done today You will be contacted by phone if any changes need to be made immediately.  Otherwise, you will receive a letter about your results with an explanation, but please check with MyChart first. You will be contacted regarding the referral for: hearing evaluation Thank you for enrolling in MyChart. Please follow the instructions below to securely access your online medical record. MyChart allows you to send messages to your doctor, view your test results, renew your prescriptions, schedule appointments, and more.  Please return in 6 months, or sooner if needed ( or one yr if you prefer)

## 2012-12-31 NOTE — Assessment & Plan Note (Signed)
Ok for f/u hearing testing

## 2013-01-01 NOTE — Assessment & Plan Note (Signed)
For prolia today 

## 2013-01-01 NOTE — Assessment & Plan Note (Signed)

## 2013-06-14 ENCOUNTER — Other Ambulatory Visit (HOSPITAL_BASED_OUTPATIENT_CLINIC_OR_DEPARTMENT_OTHER): Payer: Medicare Other

## 2013-06-16 ENCOUNTER — Other Ambulatory Visit: Payer: Self-pay

## 2013-06-20 ENCOUNTER — Ambulatory Visit (HOSPITAL_BASED_OUTPATIENT_CLINIC_OR_DEPARTMENT_OTHER): Payer: Medicare Other | Attending: Pulmonary Disease | Admitting: Radiology

## 2013-06-20 DIAGNOSIS — G4733 Obstructive sleep apnea (adult) (pediatric): Secondary | ICD-10-CM

## 2013-06-20 DIAGNOSIS — Z9989 Dependence on other enabling machines and devices: Secondary | ICD-10-CM

## 2013-06-22 ENCOUNTER — Other Ambulatory Visit: Payer: Self-pay | Admitting: Pulmonary Disease

## 2013-06-22 DIAGNOSIS — G4733 Obstructive sleep apnea (adult) (pediatric): Secondary | ICD-10-CM

## 2013-06-23 ENCOUNTER — Ambulatory Visit: Payer: Medicare Other

## 2013-06-28 ENCOUNTER — Ambulatory Visit: Payer: Medicare Other | Admitting: Internal Medicine

## 2013-06-28 ENCOUNTER — Ambulatory Visit (INDEPENDENT_AMBULATORY_CARE_PROVIDER_SITE_OTHER): Payer: Medicare Other

## 2013-06-28 DIAGNOSIS — M810 Age-related osteoporosis without current pathological fracture: Secondary | ICD-10-CM

## 2013-06-28 MED ORDER — DENOSUMAB 60 MG/ML ~~LOC~~ SOLN
60.0000 mg | Freq: Once | SUBCUTANEOUS | Status: AC
  Administered 2013-06-28: 60 mg via SUBCUTANEOUS

## 2013-09-07 ENCOUNTER — Telehealth: Payer: Self-pay

## 2013-09-07 ENCOUNTER — Ambulatory Visit (INDEPENDENT_AMBULATORY_CARE_PROVIDER_SITE_OTHER): Payer: Medicare Other | Admitting: Internal Medicine

## 2013-09-07 ENCOUNTER — Encounter: Payer: Self-pay | Admitting: Internal Medicine

## 2013-09-07 VITALS — BP 130/68 | HR 81 | Temp 97.0°F | Ht 59.0 in | Wt 104.2 lb

## 2013-09-07 DIAGNOSIS — R112 Nausea with vomiting, unspecified: Secondary | ICD-10-CM | POA: Insufficient documentation

## 2013-09-07 DIAGNOSIS — I10 Essential (primary) hypertension: Secondary | ICD-10-CM

## 2013-09-07 DIAGNOSIS — F068 Other specified mental disorders due to known physiological condition: Secondary | ICD-10-CM

## 2013-09-07 LAB — POCT URINALYSIS DIPSTICK
Bilirubin, UA: NEGATIVE
Glucose, UA: NEGATIVE
KETONES UA: NEGATIVE
Leukocytes, UA: NEGATIVE
Nitrite, UA: NEGATIVE
PH UA: 6.5
PROTEIN UA: NEGATIVE
SPEC GRAV UA: 1.015
UROBILINOGEN UA: NEGATIVE

## 2013-09-07 MED ORDER — AMOXICILLIN 500 MG PO CAPS: 1000.0000 mg | ORAL_CAPSULE | Freq: Two times a day (BID) | ORAL | Status: DC

## 2013-09-07 NOTE — Patient Instructions (Signed)
Please take all new medication as prescribed Please continue all other medications as before, and refills have been done if requested. Please have the pharmacy call with any other refills you may need.  Your form for out of hospital DNR was signed today

## 2013-09-07 NOTE — Assessment & Plan Note (Signed)
stable overall by history and exam, recent data reviewed with pt, and pt to continue medical treatment as before,  to f/u any worsening symptoms or concerns BP Readings from Last 3 Encounters:  09/07/13 130/68  12/31/12 102/62  11/15/12 118/78

## 2013-09-07 NOTE — Assessment & Plan Note (Addendum)
stable overall by history and exam,, and pt to continue medical treatment as before,  to f/u any worsening symptoms or concerns, I believe pt also competant regarding health decision and states clearly she does NOT want CPR/meds/vent/defib in the event of need to sustain life; out of hosp DNR form signed and given to daughter who has POA

## 2013-09-07 NOTE — Assessment & Plan Note (Signed)
Unclear etiology, hx otherwise benign, and exam benign, Udip + blood, we are unable to keep urine for further testing, pt unable to come back to lab tomorrow (daughter has to bring her), to tx empirically for UTI - amoxil 100 bid for 7 days,  to f/u any worsening symptoms or concerns

## 2013-09-07 NOTE — Telephone Encounter (Signed)
Patient is taking her namenda, atorvastatin and requip before bed, but unable to keep down the past 2 nights, otherwise she feels fine.  Advise please what to do,  Call back number [phone]

## 2013-09-07 NOTE — Telephone Encounter (Signed)
Please consider OV with NP today - nancy

## 2013-09-07 NOTE — Progress Notes (Signed)
   Subjective:    Patient ID: Anne Snyder, female    DOB: Sep 22, 1926, 80 y.o.   MRN: [id number]  HPI  Here to f/u with vomit of PM meds 3 nights of 4 within 10 min of taking and getting into bed., hx vague but has also had some feeling poorly and general weakness as well, without other specific symptoms; Denies urinary symptoms such as dysuria, frequency, urgency, flank pain, hematuria or n/v, fever, chills.  Denies worsening reflux, abd pain, dysphagia, n/v, bowel change or blood.  Does mention today she desires out of hospital DNR designation Dementia overall stable symptomatically , and not assoc with behavioral changes such as hallucinations, paranoia, or agitation. Pt denies chest pain, increased sob or doe, wheezing, orthopnea, PND, increased LE swelling, palpitations, dizziness or syncope.  Past Medical History  Diagnosis Date  . Sleep apnea   . Cancer     Breast malignancy  . Diverticulitis   . Heart disease   . Colon polyps   . Urine incontinence   . UTI (lower urinary tract infection)   . Impaired glucose tolerance 03/14/2011  . Arthritis   . CHF (congestive heart failure)   . GERD (gastroesophageal reflux disease)   . Kidney stones   . Hypertension   . Hyperlipidemia   . Myocardial infarct 03/14/2011  . Cervical disc disease    Past Surgical History  Procedure Laterality Date  . Breast biopsy  2002  . Abdominal hysterectomy  2001  . Heart attack  12/2005 and 01/2006  . By pass surgury  05/2006  . Cataract extraction  2005  . Masectomy  09/1999  . Coronary artery bypass graft      reports that she has never smoked. She does not have any smokeless tobacco history on file. She reports that she does not drink alcohol or use illicit drugs. family history includes Arthritis in her father, mother, and other; Cancer in her mother and other; Diabetes in her father and mother; Stroke in her father and mother. Allergies  Allergen Reactions  . Sulfa Antibiotics    Review of  Systems  Constitutional: Negative for unexpected weight change, or unusual diaphoresis  HENT: Negative for tinnitus.   Eyes: Negative for photophobia and visual disturbance.  Respiratory: Negative for choking and stridor.   Gastrointestinal: Negative for vomiting and blood in stool.  Genitourinary: Negative for hematuria and decreased urine volume.  Musculoskeletal: Negative for acute joint swelling Skin: Negative for color change and wound.  Neurological: Negative for tremors and numbness other than noted  Psychiatric/Behavioral: Negative for decreased concentration or  hyperactivity.       Objective:   Physical Exam BP 130/68  Pulse 81  Temp(Src) 97 F (36.1 C) (Oral)  Ht 4\' 11"  (1.499 m)  Wt 104 lb 4 oz (47.287 kg)  BMI 21.04 kg/m2  SpO2 94% VS noted, fatigued, nontoxic Constitutional: Pt appears well-developed and well-nourished.  HENT: Head: NCAT.  Right Ear: External ear normal.  Left Ear: External ear normal.  Eyes: Conjunctivae and EOM are normal. Pupils are equal, round, and reactive to light.  Neck: Normal range of motion. Neck supple.  Cardiovascular: Normal rate and regular rhythm.   Pulmonary/Chest: Effort normal and breath sounds normal.  Abd:  Soft, NT, non-distended, + BS, no flank tender Neurological: Pt is alert. Seems about baseline confused  Skin: Skin is warm. No erythema.  Psychiatric: Pt behavior is normal..      Assessment & Plan:

## 2013-09-07 NOTE — Telephone Encounter (Signed)
Called informed the daughter of MD instructions.  She did agree to schedule appointment.

## 2013-09-07 NOTE — Progress Notes (Signed)
Pre-visit discussion using our clinic review tool. No additional management support is needed unless otherwise documented below in the visit note.  

## 2013-09-14 ENCOUNTER — Telehealth: Payer: Self-pay | Admitting: *Deleted

## 2013-09-14 NOTE — Telephone Encounter (Signed)
Daughter called requesting a portable DNR order for patient to keep on her person to carry around with her "just in case"  Please advise.  CB# [phone]

## 2013-09-14 NOTE — Telephone Encounter (Signed)
Not sure what she is referring to, as the form signed and given to her is the acceptable and required form

## 2013-09-15 NOTE — Telephone Encounter (Signed)
Form completed and on MD's desk to sign.  Called the patient left a detailed message form done and ready for pickup at the front desk.

## 2013-09-15 NOTE — Telephone Encounter (Signed)
[place] with me, please have a out of hospital DNR form available, so I can sign when return to office

## 2013-09-15 NOTE — Telephone Encounter (Signed)
Called the daughter and she is referring to having on hand a second DNR form like she was given already to keep in the car etc. In case of emergency when not at home.

## 2013-09-16 ENCOUNTER — Telehealth: Payer: Self-pay | Admitting: *Deleted

## 2013-09-16 NOTE — Telephone Encounter (Signed)
Thayer Headings phoned leaving message on triage line yesterday at 1730, returning Robin's phone call about picking up a form. Stated if left at the front desk, she could p/u form today.  CB# [phone]

## 2013-09-16 NOTE — Telephone Encounter (Signed)
Form has been put in cabnet.  Already called left her a detailed message.

## 2013-09-18 ENCOUNTER — Other Ambulatory Visit: Payer: Self-pay | Admitting: Internal Medicine

## 2013-11-14 ENCOUNTER — Ambulatory Visit (INDEPENDENT_AMBULATORY_CARE_PROVIDER_SITE_OTHER): Payer: Medicare Other | Admitting: Pulmonary Disease

## 2013-11-14 ENCOUNTER — Encounter: Payer: Self-pay | Admitting: Pulmonary Disease

## 2013-11-14 VITALS — BP 108/70 | HR 84 | Temp 97.9°F | Ht 59.0 in | Wt 105.0 lb

## 2013-11-14 DIAGNOSIS — G4733 Obstructive sleep apnea (adult) (pediatric): Secondary | ICD-10-CM

## 2013-11-14 NOTE — Assessment & Plan Note (Signed)
The pt is doing better with cpap, but I have encouraged her to try and wear longer at night.  She will also need to keep up with mask changes and supplies.

## 2013-11-14 NOTE — Patient Instructions (Signed)
Keep trying to increase cpap usage as much as possible. Keep up with supplies and mask cushions. followup with me again in one year, but call if having issues .

## 2013-11-14 NOTE — Progress Notes (Signed)
   Subjective:    Patient ID: Anne Snyder, female    DOB: 03/23/1927, 80 y.o.   MRN: [id number]  HPI Patient comes in today for followup of her obstructive sleep apnea. She is wearing CPAP more compliantly compared to the last visit, and feels that it is helping her sleep and daytime alertness. She is having no significant issues with mask fit or pressure.   Review of Systems  Constitutional: Negative for fever and unexpected weight change.  HENT: Positive for rhinorrhea and sneezing. Negative for congestion, dental problem, ear pain, nosebleeds, postnasal drip, sinus pressure, sore throat and trouble swallowing.   Eyes: Negative for redness and itching.  Respiratory: Negative for cough, chest tightness, shortness of breath and wheezing.   Cardiovascular: Negative for palpitations and leg swelling.  Gastrointestinal: Negative for nausea and vomiting.  Genitourinary: Negative for dysuria.  Musculoskeletal: Negative for joint swelling.  Skin: Negative for rash.  Neurological: Negative for headaches.  Hematological: Does not bruise/bleed easily.  Psychiatric/Behavioral: Negative for dysphoric mood. The patient is not nervous/anxious.        Objective:   Physical Exam Well-developed female in no acute distress Nose without purulence or discharge noted No skin breakdown or pressure necrosis from the CPAP mask Neck without lymphadenopathy or thyromegaly Lower extremities without significant edema, no cyanosis Alert and oriented, moves all 4 extremities.        Assessment & Plan:

## 2013-12-15 ENCOUNTER — Other Ambulatory Visit (INDEPENDENT_AMBULATORY_CARE_PROVIDER_SITE_OTHER): Payer: Medicare Other

## 2013-12-15 ENCOUNTER — Ambulatory Visit (INDEPENDENT_AMBULATORY_CARE_PROVIDER_SITE_OTHER): Payer: Medicare Other | Admitting: Internal Medicine

## 2013-12-15 ENCOUNTER — Encounter: Payer: Self-pay | Admitting: Internal Medicine

## 2013-12-15 VITALS — BP 122/80 | HR 102 | Temp 97.9°F | Ht 59.0 in | Wt 101.5 lb

## 2013-12-15 DIAGNOSIS — Z23 Encounter for immunization: Secondary | ICD-10-CM

## 2013-12-15 DIAGNOSIS — Z0000 Encounter for general adult medical examination without abnormal findings: Secondary | ICD-10-CM

## 2013-12-15 DIAGNOSIS — J309 Allergic rhinitis, unspecified: Secondary | ICD-10-CM | POA: Insufficient documentation

## 2013-12-15 DIAGNOSIS — M25512 Pain in left shoulder: Secondary | ICD-10-CM

## 2013-12-15 DIAGNOSIS — M25519 Pain in unspecified shoulder: Secondary | ICD-10-CM

## 2013-12-15 DIAGNOSIS — E7800 Pure hypercholesterolemia, unspecified: Secondary | ICD-10-CM

## 2013-12-15 LAB — CBC WITH DIFFERENTIAL/PLATELET
BASOS PCT: 0.2 % (ref 0.0–3.0)
Basophils Absolute: 0 10*3/uL (ref 0.0–0.1)
Eosinophils Absolute: 0 10*3/uL (ref 0.0–0.7)
Eosinophils Relative: 0.5 % (ref 0.0–5.0)
HEMATOCRIT: 41.5 % (ref 36.0–46.0)
Hemoglobin: 14.2 g/dL (ref 12.0–15.0)
LYMPHS ABS: 1.3 10*3/uL (ref 0.7–4.0)
Lymphocytes Relative: 19.9 % (ref 12.0–46.0)
MCHC: 34.2 g/dL (ref 30.0–36.0)
MCV: 96 fl (ref 78.0–100.0)
MONO ABS: 0.4 10*3/uL (ref 0.1–1.0)
MONOS PCT: 6.1 % (ref 3.0–12.0)
Neutro Abs: 4.7 10*3/uL (ref 1.4–7.7)
Neutrophils Relative %: 73.3 % (ref 43.0–77.0)
PLATELETS: 313 10*3/uL (ref 150.0–400.0)
RBC: 4.32 Mil/uL (ref 3.87–5.11)
RDW: 13.2 % (ref 11.5–15.5)
WBC: 6.5 10*3/uL (ref 4.0–10.5)

## 2013-12-15 LAB — URINALYSIS, ROUTINE W REFLEX MICROSCOPIC
BILIRUBIN URINE: NEGATIVE
Hgb urine dipstick: NEGATIVE
KETONES UR: NEGATIVE
LEUKOCYTES UA: NEGATIVE
NITRITE: NEGATIVE
PH: 7 (ref 5.0–8.0)
RBC / HPF: NONE SEEN (ref 0–?)
SPECIFIC GRAVITY, URINE: 1.01 (ref 1.000–1.030)
Total Protein, Urine: NEGATIVE
Urine Glucose: NEGATIVE
Urobilinogen, UA: 0.2 (ref 0.0–1.0)
WBC, UA: NONE SEEN (ref 0–?)

## 2013-12-15 LAB — BASIC METABOLIC PANEL
BUN: 12 mg/dL (ref 6–23)
CALCIUM: 9.8 mg/dL (ref 8.4–10.5)
CO2: 29 mEq/L (ref 19–32)
CREATININE: 0.7 mg/dL (ref 0.4–1.2)
Chloride: 103 mEq/L (ref 96–112)
GFR: 79.38 mL/min (ref >60.00)
Glucose, Bld: 102 mg/dL — ABNORMAL HIGH (ref 70–99)
Potassium: 4.6 mEq/L (ref 3.5–5.1)
Sodium: 139 mEq/L (ref 135–145)

## 2013-12-15 LAB — LIPID PANEL
CHOLESTEROL: 235 mg/dL — AB (ref 0–200)
HDL: 59.9 mg/dL (ref >39.00)
LDL Cholesterol: 159 mg/dL — ABNORMAL HIGH (ref 0–99)
Total CHOL/HDL Ratio: 4
Triglycerides: 80 mg/dL (ref 0.0–149.0)
VLDL: 16 mg/dL (ref 0.0–40.0)

## 2013-12-15 LAB — HEPATIC FUNCTION PANEL
ALBUMIN: 4.2 g/dL (ref 3.5–5.2)
ALT: 11 U/L (ref 0–35)
AST: 19 U/L (ref 0–37)
Alkaline Phosphatase: 54 U/L (ref 39–117)
BILIRUBIN TOTAL: 0.9 mg/dL (ref 0.2–1.2)
Bilirubin, Direct: 0.1 mg/dL (ref 0.0–0.3)
Total Protein: 7.7 g/dL (ref 6.0–8.3)

## 2013-12-15 LAB — TSH: TSH: 1.57 u[IU]/mL (ref 0.35–4.50)

## 2013-12-15 NOTE — Patient Instructions (Signed)
You had the Prevnar pneumonia shot today  You can take Claritin for allergies, as well as mucinex OTC if needed for congestion  Please continue all other medications as before, and refills have been done if requested. Please have the pharmacy call with any other refills you may need.  Please continue your efforts at being more active, low cholesterol diet, and weight control.  You are otherwise up to date with prevention measures today.  Please go to the LAB in the Basement (turn left off the elevator) for the tests to be done today  You will be contacted by phone if any changes need to be made immediately.  Otherwise, you will receive a letter about your results with an explanation, but please check with MyChart first.  Please remember to sign up for MyChart if you have not done so, as this will be important to you in the future with finding out test results, communicating by private email, and scheduling acute appointments online when needed.  You will be contacted regarding the referral for: Dr Tamala Julian for the left shoulder  Please return in 6 months, or sooner if needed, with Lab testing done 3-5 days before  OK to cancel the late May 2015 appt

## 2013-12-15 NOTE — Progress Notes (Signed)
Pre visit review using our clinic review tool, if applicable. No additional management support is needed unless otherwise documented below in the visit note. 
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
## 2013-12-17 NOTE — Assessment & Plan Note (Signed)
Ok for claritin prn

## 2013-12-17 NOTE — Assessment & Plan Note (Signed)
?   rotater cuff tear - for sport med/dr [facility] referral

## 2013-12-17 NOTE — Assessment & Plan Note (Signed)

## 2013-12-26 ENCOUNTER — Other Ambulatory Visit (INDEPENDENT_AMBULATORY_CARE_PROVIDER_SITE_OTHER): Payer: Medicare Other

## 2013-12-26 ENCOUNTER — Ambulatory Visit (INDEPENDENT_AMBULATORY_CARE_PROVIDER_SITE_OTHER): Payer: Medicare Other | Admitting: Family Medicine

## 2013-12-26 VITALS — BP 122/72 | HR 77 | Wt 103.0 lb

## 2013-12-26 DIAGNOSIS — M25512 Pain in left shoulder: Secondary | ICD-10-CM

## 2013-12-26 DIAGNOSIS — M7500 Adhesive capsulitis of unspecified shoulder: Secondary | ICD-10-CM | POA: Insufficient documentation

## 2013-12-26 DIAGNOSIS — M25519 Pain in unspecified shoulder: Secondary | ICD-10-CM

## 2013-12-26 NOTE — Assessment & Plan Note (Signed)
Patient was given an injection today with better resolution of pain. Patient still has a catching sensation secondary to the calcific changes seen in the rotator cuff. We discussed other over-the-counter medications that could be beneficial as well as an icing regimen. We discussed the possibility of formal physical therapy the patient like to try home therapy first. Patient will try these interventions and come back again in 3 weeks. Depending on findings we may need to consider further imaging and further treatment options. We'll discuss that further evaluation.

## 2013-12-26 NOTE — Patient Instructions (Signed)
Good to meet you Ice 20 minutes 2 times a day Frozen shoulder and arthritis.  Exercises 3 times a week.  Take tylenol 650 mg three times a day is the best evidence based medicine we have for arthritis.  Glucosamine sulfate 750mg  twice a day is a supplement that has been shown to help moderate to severe arthritis. Vitamin D 2000 IU daily Fish oil 2 grams daily.  Tumeric 500mg  twice daily.  Capsaicin topically up to four times a day may also help with pain. It's important that you continue to stay active. Controlling your weight is important.  Water aerobics and cycling with low resistance are the best two types of exercise for arthritis. Come back and see me in 3 weeks.

## 2013-12-26 NOTE — Progress Notes (Signed)
[facility] [address] [place], [place] [address] Phone: [phone] Subjective:    I'm seeing this patient by the request  of:  Cathlean Cower, MD   CC: Left shoulder pain.   [id number] Anne Snyder is a 80 y.o. female coming in with complaint of left shoulder pain. Patient has had this pain for approximately 1 year she states. Does not remember any trace injury. Patient has been using it less and less secondary to the amount of pain. Patient does notice that she does not have full range of motion compared to her other side. Patient is left-handed she states. Denies any radiation down the arm or any numbness. She states that it does have pain certain movements. Patient describes it as more of a dull aching with stabbing pain with certain movements. Over-the-counter medications have not been beneficial.     Past medical history, social, surgical and family history all reviewed in electronic medical record.   Review of Systems: No headache, visual changes, nausea, vomiting, diarrhea, constipation, dizziness, abdominal pain, skin rash, fevers, chills, night sweats, weight loss, swollen lymph nodes, body aches, joint swelling, muscle aches, chest pain, shortness of breath, mood changes.   Objective Blood pressure 122/72, pulse 77, weight 103 lb (46.72 kg), SpO2 97.00%.  General: No apparent distress alert and oriented x3 mood and affect normal, dressed appropriately.  HEENT: Pupils equal, extraocular movements intact  Respiratory: Patient's speak in full sentences and does not appear short of breath  Cardiovascular: No lower extremity edema, non tender, no erythema  Skin: Warm dry intact with no signs of infection or rash on extremities or on axial skeleton.  Abdomen: Soft nontender  Neuro: Cranial nerves II through XII are intact, neurovascularly intact in all extremities with 2+ DTRs and 2+ pulses.  Lymph: No lymphadenopathy of posterior or anterior  cervical chain or axillae bilaterally.  Gait normal with good balance and coordination.  MSK:  Non tender with full range of motion and good stability and symmetric strength and tone of shoulders, elbows, wrist, hip, knee and ankles bilaterally.  Shoulder: left Inspection reveals no abnormalities, atrophy or asymmetry. Palpation is normal with no tenderness over AC joint or bicipital groove. ROM is restricted even passively to 4 flexion of the 170 external rotation of 30 internal rotation to L5. A full range of motion on the contralateral side. Rotator cuff strength normal throughout. signs of impingement with positive Neer and Hawkin's tests, but negative empty can sign. Speeds and Yergason's tests normal. No labral pathology noted with negative Obrien's, negative clunk and good stability. Normal scapular function observed. No painful arc and no drop arm sign. No apprehension sign Contralateral shoulder unremarkable  MSK US performed of: left This study was ordered, performed, and interpreted by Charlann Boxer D.O.  Shoulder:   Supraspinatus:  Appears normal on long and transverse views, of degenerative changes noted but no true tear appreciated Bursal bulge seen with shoulder abduction on impingement view. Infraspinatus:  Appears normal on long and transverse views. Degenerative changes Significant increase in Doppler flow Subscapularis:  Appears normal on long and transverse views. Positive bursa Teres Minor:  Appears normal on long and transverse views. AC joint:  Capsule distended with OA changes.  Glenohumeral Joint:  OA noted.  Glenoid Labrum:  Intact without visualized tears. Biceps Tendon:  Appears normal on long and transverse views, no fraying of tendon, tendon located in intertubercular groove, no subluxation with shoulder internal or external rotation.  Impression: Degenerative  osteoarthritic changes with mild frozen shoulder and calcific rotator cuff with degenerative  changes.  Procedure: Real-time Ultrasound Guided Injection of left glenohumeral joint Device: GE Logiq E  Ultrasound guided injection is preferred based studies that show increased duration, increased effect, greater accuracy, decreased procedural pain, increased response rate with ultrasound guided versus blind injection.  Verbal informed consent obtained.  Time-out conducted.  Noted no overlying erythema, induration, or other signs of local infection.  Skin prepped in a sterile fashion.  Local anesthesia: Topical Ethyl chloride.  With sterile technique and under real time ultrasound guidance:  Joint visualized.  23g 1  inch needle inserted posterior approach. Pictures taken for needle placement. Patient did have injection of 2 cc of 1% lidocaine, 2 cc of 0.5% Marcaine, and 1.0 cc of Kenalog 40 mg/dL. Completed without difficulty  Pain immediately resolved suggesting accurate placement of the medication.  Advised to call if fevers/chills, erythema, induration, drainage, or persistent bleeding.  Images permanently stored and available for review in the ultrasound unit.  Impression: Technically successful ultrasound guided injection.    Impression and Recommendations:     This case required medical decision making of moderate complexity.

## 2014-01-03 ENCOUNTER — Encounter: Payer: Medicare Other | Admitting: Internal Medicine

## 2014-01-10 ENCOUNTER — Telehealth: Payer: Self-pay | Admitting: Internal Medicine

## 2014-01-10 NOTE — Telephone Encounter (Signed)
Sent info to [employer] for insurance verification, will notify you as soon as I have a response. Thank you.

## 2014-01-16 ENCOUNTER — Encounter: Payer: Self-pay | Admitting: Family Medicine

## 2014-01-16 ENCOUNTER — Ambulatory Visit (INDEPENDENT_AMBULATORY_CARE_PROVIDER_SITE_OTHER): Payer: Medicare Other | Admitting: Family Medicine

## 2014-01-16 VITALS — BP 104/62 | HR 94 | Ht 59.0 in | Wt 101.0 lb

## 2014-01-16 DIAGNOSIS — M25519 Pain in unspecified shoulder: Secondary | ICD-10-CM

## 2014-01-16 DIAGNOSIS — M7500 Adhesive capsulitis of unspecified shoulder: Secondary | ICD-10-CM

## 2014-01-16 DIAGNOSIS — M25512 Pain in left shoulder: Secondary | ICD-10-CM

## 2014-01-16 NOTE — Patient Instructions (Signed)
Good to see you Continue your exercise class and add Thursday.  Physical therapy will be calling you.  Ice is your best friend Come back again in 4 weeks. If we need to may need an injection again.

## 2014-01-16 NOTE — Assessment & Plan Note (Signed)
Patient has made considerable amount of improvement in pain but has not increasing her range of motion. Patient does have a calcific changes of the rotator cuff as well as the osteoporosis that could also be contributing. I would like patient to start formal physical therapy and continue the home exercises as well as the icing protocol. Patient will followup again in 3-4 weeks. At continuing to have difficulty that we may need to consider another intra-articular injection.  Spent greater than 25 minutes with patient face-to-face and had greater than 50% of counseling including as described above in assessment and plan.

## 2014-01-16 NOTE — Progress Notes (Signed)
  [facility] [address] [place], [place] [address] Phone: [phone] Subjective:     CC: Left shoulder pain follow up.   [id number] Anne Snyder is a 80 y.o. female coming in with complaint of left shoulder pain. Patient was seen previously, and was diagnosed with calcific rotator cuff tendinopathy as well as osteoarthritic changes. Patient was given an intra-articular injection. Patient is given home exercises, icing protocol, and over-the-counter medications. Patient states overall she is about 70-80% better. Patient states that the pain has improved significantly but still has decreased range of motion. Denies any new symptoms. Patient states she is resting comfortably.    Past medical history, social, surgical and family history all reviewed in electronic medical record.   Review of Systems: No headache, visual changes, nausea, vomiting, diarrhea, constipation, dizziness, abdominal pain, skin rash, fevers, chills, night sweats, weight loss, swollen lymph nodes, body aches, joint swelling, muscle aches, chest pain, shortness of breath, mood changes.   Objective Blood pressure 104/62, pulse 94, height 4\' 11"  (1.499 m), weight 101 lb (45.813 kg), SpO2 96.00%.  General: No apparent distress alert and oriented x3 mood and affect normal, dressed appropriately.  HEENT: Pupils equal, extraocular movements intact  Respiratory: Patient's speak in full sentences and does not appear short of breath  Cardiovascular: No lower extremity edema, non tender, no erythema  Skin: Warm dry intact with no signs of infection or rash on extremities or on axial skeleton.  Abdomen: Soft nontender  Neuro: Cranial nerves II through XII are intact, neurovascularly intact in all extremities with 2+ DTRs and 2+ pulses.  Lymph: No lymphadenopathy of posterior or anterior cervical chain or axillae bilaterally.  Gait normal with good balance and coordination.  MSK:  Non tender  with full range of motion and good stability and symmetric strength and tone of shoulders, elbows, wrist, hip, knee and ankles bilaterally.  Shoulder: left Inspection reveals no abnormalities, atrophy or asymmetry. Palpation is normal with no tenderness over AC joint or bicipital groove. ROM is restricted even passively to 4 flexion of the 160 external rotation of 10 internal rotation to L5. A full range of motion on the contralateral side. This is actually decreased from previous exam Rotator cuff strength normal throughout. signs of impingement with positive Neer and Hawkin's tests, but negative empty can sign. Pain though is considerably better than previous exam. Speeds and Yergason's tests normal. No labral pathology noted with negative Obrien's, negative clunk and good stability. Normal scapular function observed. No painful arc and no drop arm sign. No apprehension sign Contralateral shoulder unremarkable    Impression and Recommendations:     This case required medical decision making of moderate complexity.

## 2014-01-16 NOTE — Telephone Encounter (Signed)
Pt called requesting status on Prolia verification.  Please advise

## 2014-01-16 NOTE — Telephone Encounter (Signed)
Called pt to inform that we are waiting for the insurance to response to our office, will call ASAP when we hear something back.

## 2014-01-22 ENCOUNTER — Other Ambulatory Visit: Payer: Self-pay | Admitting: Internal Medicine

## 2014-01-24 ENCOUNTER — Encounter: Payer: Self-pay | Admitting: Family Medicine

## 2014-01-24 ENCOUNTER — Telehealth: Payer: Self-pay

## 2014-01-24 ENCOUNTER — Ambulatory Visit (INDEPENDENT_AMBULATORY_CARE_PROVIDER_SITE_OTHER): Payer: Medicare Other | Admitting: Family Medicine

## 2014-01-24 VITALS — BP 126/78 | HR 68 | Temp 98.0°F | Ht 59.0 in | Wt 103.0 lb

## 2014-01-24 DIAGNOSIS — R197 Diarrhea, unspecified: Secondary | ICD-10-CM

## 2014-01-24 DIAGNOSIS — IMO0001 Reserved for inherently not codable concepts without codable children: Secondary | ICD-10-CM

## 2014-01-24 DIAGNOSIS — R030 Elevated blood-pressure reading, without diagnosis of hypertension: Secondary | ICD-10-CM

## 2014-01-24 DIAGNOSIS — R112 Nausea with vomiting, unspecified: Secondary | ICD-10-CM

## 2014-01-24 NOTE — Progress Notes (Addendum)
No chief complaint on file.   HPI:  Acute visit for:  HTN: -pt of Dr. Jenny Reichmann - PCP and PCP office not available -reports: check BP today 161/82 - has hx of labile HTN when eats sweets (gets sick with diarrhea and vomiting, feels off balance and then BP goes up)  -daughter and pt report this happens once per month - nausea, vomiting, diarrhea, dizziness and elevated BP - reports Dr. Jenny Reichmann is aware of this, reports family and pt opted not to do any work up for this as does not want testing or treatment if any cancer, etc, DNR -1 episode of vomiting and diarrhea last night/this morning -reports intermittently has low blood pressure so has had most BP medications reduced per daughter -denies: CP, SOB, DOE, blood in stools, palpitations, swelling, abd pain  ROS: See pertinent positives and negatives per HPI.  Past Medical History  Diagnosis Date  . Sleep apnea   . Cancer     Breast malignancy  . Diverticulitis   . Heart disease   . Colon polyps   . Urine incontinence   . UTI (lower urinary tract infection)   . Impaired glucose tolerance 03/14/2011  . Arthritis   . CHF (congestive heart failure)   . GERD (gastroesophageal reflux disease)   . Kidney stones   . Hypertension   . Hyperlipidemia   . Myocardial infarct 03/14/2011  . Cervical disc disease     Past Surgical History  Procedure Laterality Date  . Breast biopsy  2002  . Abdominal hysterectomy  2001  . Heart attack  12/2005 and 01/2006  . By pass surgury  05/2006  . Cataract extraction  2005  . Masectomy  09/1999  . Coronary artery bypass graft      Family History  Problem Relation Age of Onset  . Arthritis Mother   . Cancer Mother     breast cancer  . Stroke Mother   . Diabetes Mother   . Arthritis Father   . Stroke Father   . Diabetes Father   . Arthritis Other   . Cancer Other     breast and lung cancer    History   Social History  . Marital Status: Divorced    Spouse Name: N/A    Number of Children: N/A   . Years of Education: N/A   Occupational History  . retired [employer]   Social History Main Topics  . Smoking status: Never Smoker   . Smokeless tobacco: None  . Alcohol Use: No  . Drug Use: No  . Sexual Activity: None   Other Topics Concern  . None   Social History Narrative  . None    Current outpatient prescriptions:aspirin 325 MG tablet, Take 325 mg by mouth daily.  , Disp: , Rfl: ;  Calcium Carbonate (CALTRATE 600 PO), Take 1 tablet by mouth 2 (two) times daily.  , Disp: , Rfl: ;  carvedilol (COREG) 3.125 MG tablet, Take 1 tablet by mouth  daily., Disp: 90 tablet, Rfl: 3;  Cholecalciferol (VITAMIN D) 2000 UNITS CAPS, Take by mouth., Disp: , Rfl:  diphenoxylate-atropine (LOMOTIL) 2.5-0.025 MG per tablet, Take 1 tablet by mouth 4 (four) times daily as needed for diarrhea or loose stools., Disp: 40 tablet, Rfl: 0;  donepezil (ARICEPT) 23 MG TABS tablet, Take 1 tablet (23 mg total) by mouth daily., Disp: 90 tablet, Rfl: 3;  Multiple Vitamin (MULTIVITAMIN) capsule, Take 1 capsule by mouth daily., Disp: , Rfl:  NAMENDA 10 MG tablet,  Take 1 tablet by mouth two  times daily, Disp: 180 tablet, Rfl: 3;  NON FORMULARY, Tumeric, Disp: , Rfl: ;  omeprazole (PRILOSEC) 40 MG capsule, Take 40 mg by mouth daily.  , Disp: , Rfl: ;  oxybutynin (DITROPAN) 5 MG tablet, Take 1 tablet (5 mg total) by mouth 2 (two) times daily., Disp: 180 tablet, Rfl: 3;  rOPINIRole (REQUIP) 1 MG tablet, Take once daily at bedtime, Disp: 90 tablet, Rfl: 3 sertraline (ZOLOFT) 100 MG tablet, Take 1 tablet (100 mg total) by mouth daily., Disp: 90 tablet, Rfl: 3;  atorvastatin (LIPITOR) 40 MG tablet, Take 0.5 tablets (20 mg total) by mouth daily., Disp: 90 tablet, Rfl: 3;  [DISCONTINUED] tolterodine (DETROL) 2 MG tablet, Take 1 tablet (2 mg total) by mouth 2 (two) times daily., Disp: 60 tablet, Rfl: 11  EXAM:  Filed Vitals:   01/24/14 1021  BP: 126/78  Pulse: 68  Temp: 98 F (36.7 C)    Body mass index is 20.79  kg/(m^2).  GENERAL: vitals reviewed and listed above, alert, oriented, appears well hydrated and in no acute distress  HEENT: atraumatic, conjunttiva clear, no obvious abnormalities on inspection of external nose and ears  NECK: no obvious masses on inspection  LUNGS: clear to auscultation bilaterally, no wheezes, rales or rhonchi, good air movement  CV: HRRR, no peripheral edema  ABD: BS+, soft NTTP  MS: moves all extremities without noticeable abnormality  NEURO: normal gait, CN II-XII grossly intact, PERRLA, no nystagmus, no pronator drift, finger to nose normal  PSYCH: pleasant and cooperative, no obvious depression or anxiety  ASSESSMENT AND PLAN:  Discussed the following assessment and plan:  Nausea with vomiting  Diarrhea  Elevated blood pressure  -Patient advised to return or notify a doctor immediately if symptoms worsen or persist or new concerns arise.  Patient Instructions  -avoid food that cause your symptoms  -use loperamide  -stay hydrated - plenty of liquids when having the diarrhea and vomiting  -follow up with Dr. Jenny Reichmann as needed     Lucretia Kern.

## 2014-01-24 NOTE — Telephone Encounter (Signed)
It is not clear the two are related, to consider OV

## 2014-01-24 NOTE — Progress Notes (Signed)
Pre visit review using our clinic review tool, if applicable. No additional management support is needed unless otherwise documented below in the visit note. 

## 2014-01-24 NOTE — Patient Instructions (Signed)
-  avoid food that cause your symptoms  -use loperamide  -stay hydrated - plenty of liquids when having the diarrhea and vomiting  -follow up with Dr. Jenny Reichmann as needed

## 2014-01-24 NOTE — Telephone Encounter (Signed)
Pt's daughter states that the pt's bp is 161/81 and is experiencing dizziness. Please advise

## 2014-01-24 NOTE — Telephone Encounter (Signed)
Informed the patients daughter of MD instructions.  The daughter scheduled her an appointment today with Dr. Maudie Mercury.

## 2014-01-25 NOTE — Telephone Encounter (Signed)
Patients daughter has been informed of insurance information and scheduled the patient an appointment for her next prolia. Prolia has been ordered for this patient.

## 2014-01-25 NOTE — Telephone Encounter (Signed)
Prolia sent insurance verification back stating, "provider is out of network and this plan has no out of network benefits. Patient has no coverage for Prolia administration or office visit".  I researched and patient had the same insurance in November, 2014 which covered Prolia. I understand plans do change, but I called [employer] and spoke w/MaDonna.  MaDonna informed me that patient has $0 deductible; out of pocket max $4500, which she's met $269.30. She says patient does have coverage for Prolia and her approximate responsibility will be $10 PLUS 20% co-insurance, which will be approximately $190. MaDonna gave me a reference [id number] in the event there are further questions or problems.  Please let patient know this is an estimate. I apologize for the length of this but I just wanted everything documented. Thank you.

## 2014-02-07 ENCOUNTER — Ambulatory Visit (INDEPENDENT_AMBULATORY_CARE_PROVIDER_SITE_OTHER): Payer: Medicare Other

## 2014-02-07 DIAGNOSIS — M810 Age-related osteoporosis without current pathological fracture: Secondary | ICD-10-CM

## 2014-02-07 MED ORDER — DENOSUMAB 60 MG/ML ~~LOC~~ SOLN
60.0000 mg | Freq: Once | SUBCUTANEOUS | Status: AC
  Administered 2014-02-07: 60 mg via SUBCUTANEOUS

## 2014-02-13 ENCOUNTER — Encounter: Payer: Self-pay | Admitting: Family Medicine

## 2014-02-13 ENCOUNTER — Ambulatory Visit (INDEPENDENT_AMBULATORY_CARE_PROVIDER_SITE_OTHER): Payer: Medicare Other | Admitting: Family Medicine

## 2014-02-13 VITALS — BP 120/68 | HR 81 | Wt 100.0 lb

## 2014-02-13 DIAGNOSIS — M7502 Adhesive capsulitis of left shoulder: Secondary | ICD-10-CM

## 2014-02-13 DIAGNOSIS — M7500 Adhesive capsulitis of unspecified shoulder: Secondary | ICD-10-CM

## 2014-02-13 NOTE — Patient Instructions (Signed)
It is good to see you You are doing great!!! Ice when you need it.  Finish physical therapy this week.  See me again in 4 weeks or when you need me.

## 2014-02-13 NOTE — Progress Notes (Signed)
  [name] [address] [place], [place] [address] Phone: [phone] Subjective:     CC: Left shoulder pain follow up.   [id number] Josefine Fuhr is a 80 y.o. female coming in with complaint of left shoulder pain. Patient was seen previously, and was diagnosed with calcific rotator cuff tendinopathy as well as osteoarthritic changes. Patient was also developing an adhesive capsulitis at last visit and the started in formal physical therapy. Patient was doing better at last visit and states she is doing extremely better. Patient states that she is not having any pain. Patient still cannot have full range of motion of the shoulder but overall is doing better. Patient has been going to physical therapy 2 times a week and has one more week remaining. Patient denies any new symptoms and is happy with the results so far. Denies any nighttime awakening. Denies any weakness.    Past medical history, social, surgical and family history all reviewed in electronic medical record.   Review of Systems: No headache, visual changes, nausea, vomiting, diarrhea, constipation, dizziness, abdominal pain, skin rash, fevers, chills, night sweats, weight loss, swollen lymph nodes, body aches, joint swelling, muscle aches, chest pain, shortness of breath, mood changes.   Objective Blood pressure 120/68, pulse 81, weight 100 lb (45.36 kg), SpO2 95.00%.  General: No apparent distress alert and oriented x3 mood and affect normal, dressed appropriately.  HEENT: Pupils equal, extraocular movements intact  Respiratory: Patient's speak in full sentences and does not appear short of breath  Cardiovascular: No lower extremity edema, non tender, no erythema  Skin: Warm dry intact with no signs of infection or rash on extremities or on axial skeleton.  Abdomen: Soft nontender  Neuro: Cranial nerves II through XII are intact, neurovascularly intact in all extremities with 2+ DTRs and 2+  pulses.  Lymph: No lymphadenopathy of posterior or anterior cervical chain or axillae bilaterally.  Gait normal with good balance and coordination.  MSK:  Non tender with full range of motion and good stability and symmetric strength and tone of shoulders, elbows, wrist, hip, knee and ankles bilaterally.  Shoulder: left Inspection reveals no abnormalities, atrophy or asymmetry. Palpation is normal with no tenderness over AC joint or bicipital groove. ROM is restricted even passively to forward flexion of the 170 external rotation of 15 internal rotation to L3. All of which are improvement from previous exam. A full range of motion on the contralateral side.  Rotator cuff strength normal throughout. signs of impingement with  Neer and Hawkin's tests negative Speeds and Yergason's tests normal. No labral pathology noted with negative Obrien's, negative clunk and good stability. Normal scapular function observed. No painful arc and no drop arm sign. No apprehension sign Contralateral shoulder unremarkable    Impression and Recommendations:     This case required medical decision making of moderate complexity.

## 2014-02-13 NOTE — Assessment & Plan Note (Signed)
Overall patient is doing significantly better at this time. We discussed a repeat intra-articular injection which patient declined because she is doing so well and is having no pain. Encourage her to continue the exercises 3 times a week for another 6 weeks and finish up with physical therapy over the course of the next one to 2 weeks. We discussed continuing icing regimen. Patient will check in with me again in approximately 4 weeks. If she is having any decrease in range of motion we will do another intra-articular injection.

## 2014-02-19 ENCOUNTER — Other Ambulatory Visit: Payer: Self-pay | Admitting: Internal Medicine

## 2014-03-13 ENCOUNTER — Ambulatory Visit: Payer: Medicare Other | Admitting: Family Medicine

## 2014-03-29 ENCOUNTER — Telehealth: Payer: Self-pay | Admitting: Pulmonary Disease

## 2014-03-29 NOTE — Telephone Encounter (Signed)
LMTCB

## 2014-03-29 NOTE — Telephone Encounter (Signed)
June 5th-was told by [facility] from [place] her not to call [employer] anymore for her CPAP supplies; that all her supplies would be sent automatically every 90 days. Pt states she was sent supplies that she does not need-will call [employer] to let them know of this and see how to proceed in the future for supplies needed. Nothing needed from our office.

## 2014-04-19 ENCOUNTER — Other Ambulatory Visit: Payer: Self-pay | Admitting: Internal Medicine

## 2014-04-19 NOTE — Telephone Encounter (Signed)
Done erx 

## 2014-05-02 IMAGING — CR DG CHEST 2V
2 series · 2 of 2 positions shown · non-contrast
Comparison: None.

CLINICAL DATA: Fatigue.

CHEST - 2 VIEW

[view not recorded (1 of 2)]
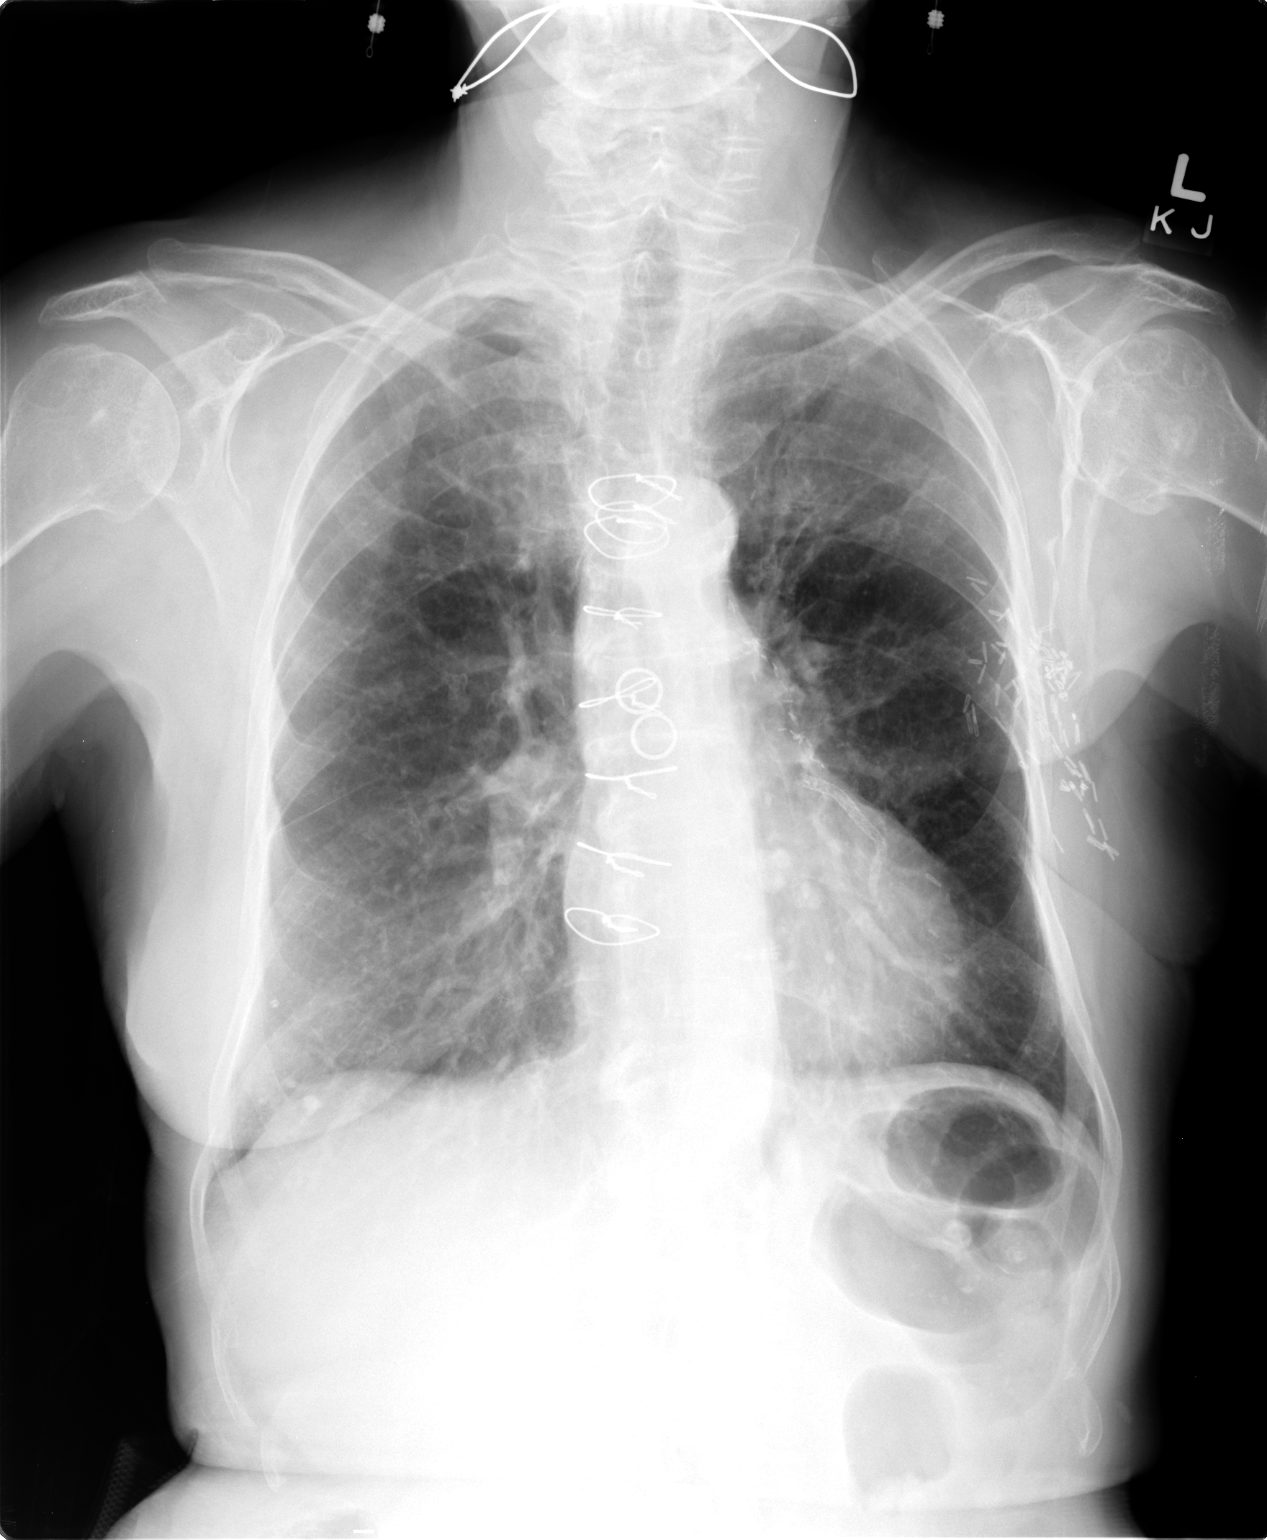

[view not recorded (2 of 2)]
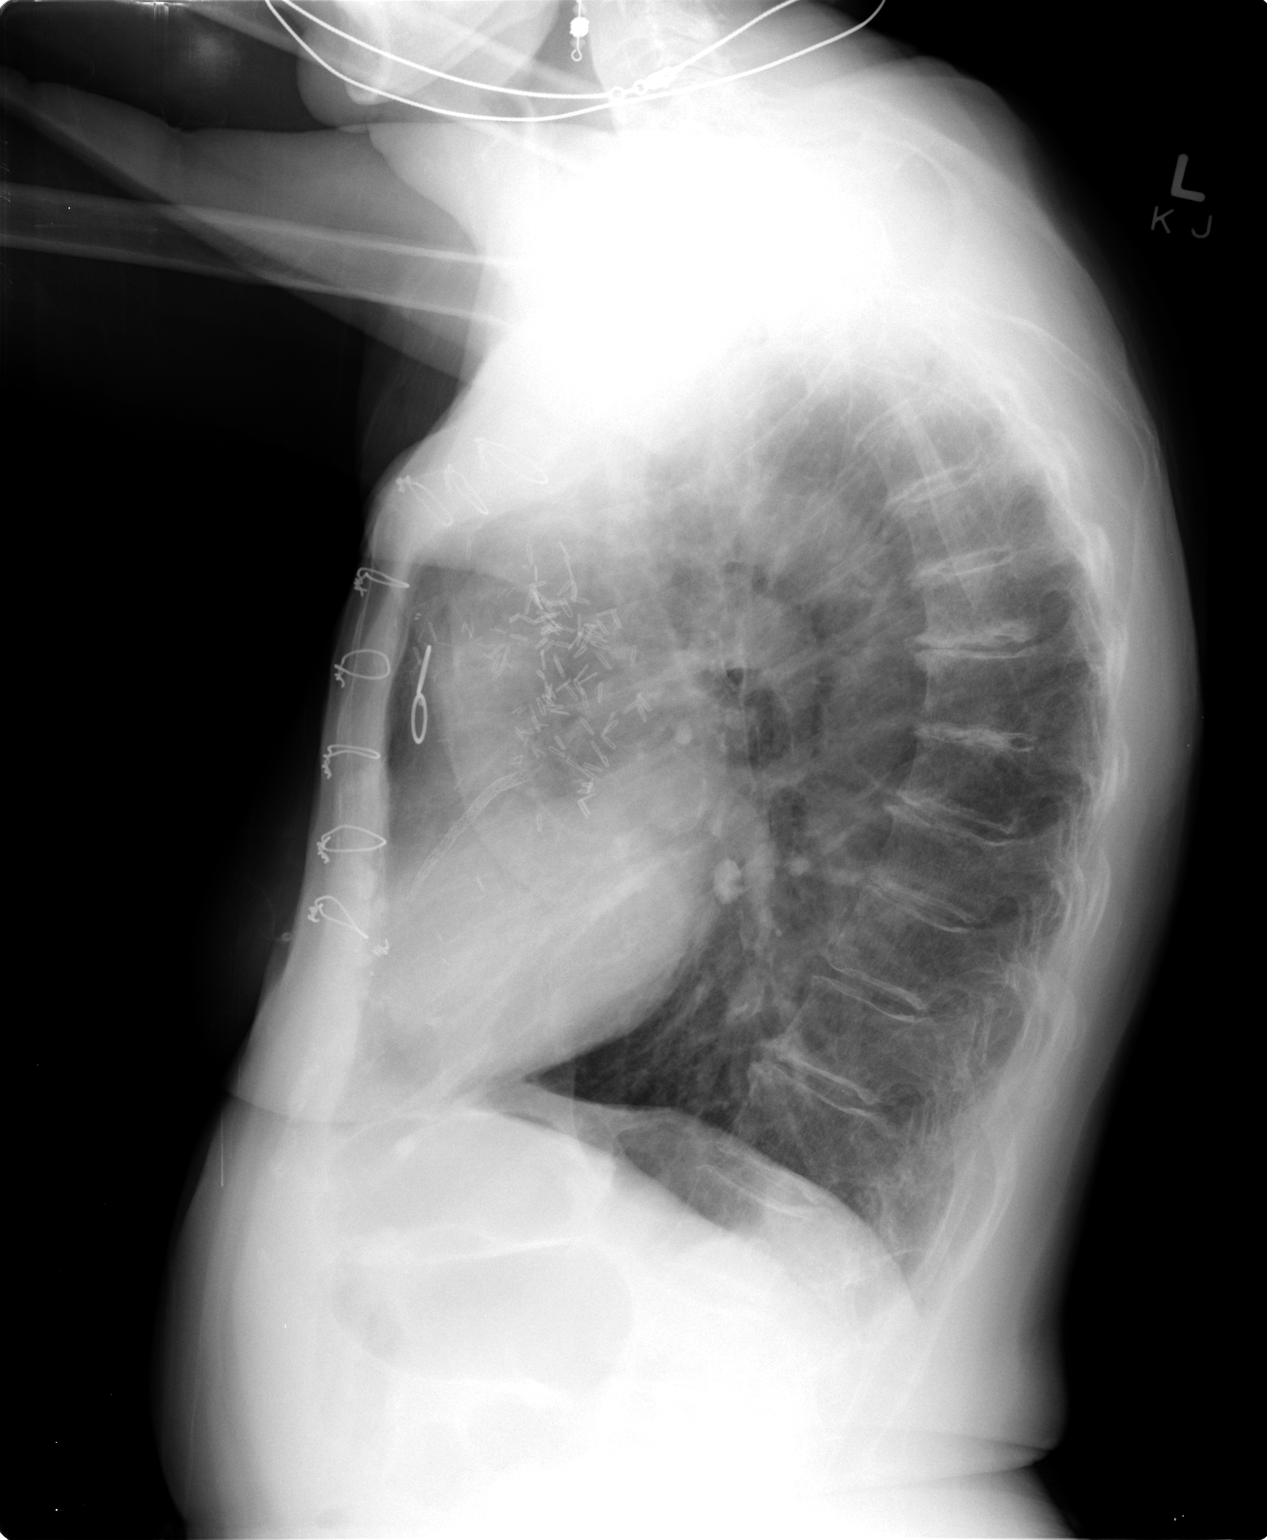

[2 of 2 positions shown; findings below may reference images not displayed]

FINDINGS: There is hyperinflation of the lungs compatible with
COPD.  Prior CABG.  Prior left mastectomy and lymph node
dissection.  Heart is normal size.  No confluent airspace
opacities.  Calcified granulomas in the lungs.  No acute bony
abnormality or effusion.
IMPRESSION: COPD.  No active disease.

## 2014-05-24 ENCOUNTER — Telehealth: Payer: Self-pay | Admitting: *Deleted

## 2014-05-24 DIAGNOSIS — R7302 Impaired glucose tolerance (oral): Secondary | ICD-10-CM

## 2014-05-24 DIAGNOSIS — Z0000 Encounter for general adult medical examination without abnormal findings: Secondary | ICD-10-CM

## 2014-05-24 NOTE — Telephone Encounter (Signed)
Orders done

## 2014-05-24 NOTE — Telephone Encounter (Signed)
Left msg on triage stating have appt with Dr. Jenny Reichmann in Nov, but wanting to get flu shot. Called pt back no answer x's 10 rings...Johny Chess

## 2014-05-24 NOTE — Telephone Encounter (Signed)
Notified daughter order has been place...Johny Chess

## 2014-05-24 NOTE — Telephone Encounter (Signed)
Called pt back spoke with daughter Thayer Headings. Went ahead and made appt for flu clinic. Daughter also stated can she get her blood work on that day as well. MD told them to have labs done prior to her appt in Nov. Inform daughter will have to send md a msg because there is no order in computer. Pls advise...Johny Chess

## 2014-05-26 ENCOUNTER — Other Ambulatory Visit: Payer: Self-pay

## 2014-06-09 ENCOUNTER — Other Ambulatory Visit (INDEPENDENT_AMBULATORY_CARE_PROVIDER_SITE_OTHER): Payer: Medicare Other

## 2014-06-09 ENCOUNTER — Ambulatory Visit (INDEPENDENT_AMBULATORY_CARE_PROVIDER_SITE_OTHER): Payer: Medicare Other

## 2014-06-09 DIAGNOSIS — Z23 Encounter for immunization: Secondary | ICD-10-CM

## 2014-06-09 DIAGNOSIS — R7302 Impaired glucose tolerance (oral): Secondary | ICD-10-CM

## 2014-06-09 DIAGNOSIS — E785 Hyperlipidemia, unspecified: Secondary | ICD-10-CM

## 2014-06-09 DIAGNOSIS — I10 Essential (primary) hypertension: Secondary | ICD-10-CM

## 2014-06-09 DIAGNOSIS — Z0000 Encounter for general adult medical examination without abnormal findings: Secondary | ICD-10-CM

## 2014-06-09 DIAGNOSIS — Z0189 Encounter for other specified special examinations: Secondary | ICD-10-CM

## 2014-06-09 LAB — BASIC METABOLIC PANEL
BUN: 14 mg/dL (ref 6–23)
CHLORIDE: 102 meq/L (ref 96–112)
CO2: 28 meq/L (ref 19–32)
CREATININE: 0.8 mg/dL (ref 0.4–1.2)
Calcium: 10.4 mg/dL (ref 8.4–10.5)
GFR: 74.62 mL/min (ref >60.00)
GLUCOSE: 90 mg/dL (ref 70–99)
Potassium: 4.5 mEq/L (ref 3.5–5.1)
Sodium: 137 mEq/L (ref 135–145)

## 2014-06-09 LAB — URINALYSIS, ROUTINE W REFLEX MICROSCOPIC
Bilirubin Urine: NEGATIVE
Hgb urine dipstick: NEGATIVE
KETONES UR: NEGATIVE
LEUKOCYTES UA: NEGATIVE
NITRITE: NEGATIVE
PH: 6 (ref 5.0–8.0)
RBC / HPF: NONE SEEN (ref 0–?)
Specific Gravity, Urine: <=1.005 — AB (ref 1.000–1.030)
Total Protein, Urine: NEGATIVE
UROBILINOGEN UA: 0.2 (ref 0.0–1.0)
Urine Glucose: NEGATIVE
WBC, UA: NONE SEEN (ref 0–?)

## 2014-06-09 LAB — HEPATIC FUNCTION PANEL
ALT: 14 U/L (ref 0–35)
AST: 20 U/L (ref 0–37)
Albumin: 3.5 g/dL (ref 3.5–5.2)
Alkaline Phosphatase: 49 U/L (ref 39–117)
Bilirubin, Direct: 0.1 mg/dL (ref 0.0–0.3)
TOTAL PROTEIN: 7.2 g/dL (ref 6.0–8.3)
Total Bilirubin: 0.7 mg/dL (ref 0.2–1.2)

## 2014-06-09 LAB — CBC WITH DIFFERENTIAL/PLATELET
BASOS PCT: 0.8 % (ref 0.0–3.0)
Basophils Absolute: 0.1 10*3/uL (ref 0.0–0.1)
EOS PCT: 1.9 % (ref 0.0–5.0)
Eosinophils Absolute: 0.1 10*3/uL (ref 0.0–0.7)
HCT: 42.1 % (ref 36.0–46.0)
Hemoglobin: 14 g/dL (ref 12.0–15.0)
LYMPHS PCT: 21.5 % (ref 12.0–46.0)
Lymphs Abs: 1.4 10*3/uL (ref 0.7–4.0)
MCHC: 33.2 g/dL (ref 30.0–36.0)
MCV: 95.9 fl (ref 78.0–100.0)
MONO ABS: 0.6 10*3/uL (ref 0.1–1.0)
Monocytes Relative: 9.4 % (ref 3.0–12.0)
NEUTROS PCT: 66.4 % (ref 43.0–77.0)
Neutro Abs: 4.3 10*3/uL (ref 1.4–7.7)
PLATELETS: 301 10*3/uL (ref 150.0–400.0)
RBC: 4.39 Mil/uL (ref 3.87–5.11)
RDW: 13.3 % (ref 11.5–15.5)
WBC: 6.5 10*3/uL (ref 4.0–10.5)

## 2014-06-09 LAB — LIPID PANEL
CHOL/HDL RATIO: 3
Cholesterol: 216 mg/dL — ABNORMAL HIGH (ref 0–200)
HDL: 69.9 mg/dL (ref >39.00)
LDL Cholesterol: 128 mg/dL — ABNORMAL HIGH (ref 0–99)
NonHDL: 146.1
Triglycerides: 90 mg/dL (ref 0.0–149.0)
VLDL: 18 mg/dL (ref 0.0–40.0)

## 2014-06-09 LAB — TSH: TSH: 2.35 u[IU]/mL (ref 0.35–4.50)

## 2014-06-09 LAB — HEMOGLOBIN A1C: HEMOGLOBIN A1C: 5.9 % (ref 4.6–6.5)

## 2014-06-14 ENCOUNTER — Ambulatory Visit (INDEPENDENT_AMBULATORY_CARE_PROVIDER_SITE_OTHER): Payer: Medicare Other | Admitting: Internal Medicine

## 2014-06-14 ENCOUNTER — Encounter: Payer: Self-pay | Admitting: Internal Medicine

## 2014-06-14 VITALS — BP 124/72 | HR 108 | Temp 98.5°F | Ht 59.0 in | Wt 101.2 lb

## 2014-06-14 DIAGNOSIS — Z0000 Encounter for general adult medical examination without abnormal findings: Secondary | ICD-10-CM

## 2014-06-14 DIAGNOSIS — I10 Essential (primary) hypertension: Secondary | ICD-10-CM

## 2014-06-14 DIAGNOSIS — F0390 Unspecified dementia without behavioral disturbance: Secondary | ICD-10-CM

## 2014-06-14 DIAGNOSIS — R197 Diarrhea, unspecified: Secondary | ICD-10-CM

## 2014-06-14 DIAGNOSIS — R7302 Impaired glucose tolerance (oral): Secondary | ICD-10-CM

## 2014-06-14 MED ORDER — MEMANTINE HCL 10 MG PO TABS: ORAL_TABLET | ORAL | Status: DC

## 2014-06-14 MED ORDER — OXYBUTYNIN CHLORIDE 5 MG PO TABS: ORAL_TABLET | ORAL | Status: DC

## 2014-06-14 MED ORDER — OMEPRAZOLE 40 MG PO CPDR: 40.0000 mg | DELAYED_RELEASE_CAPSULE | Freq: Every day | ORAL | Status: DC

## 2014-06-14 MED ORDER — CARVEDILOL 3.125 MG PO TABS: ORAL_TABLET | ORAL | Status: DC

## 2014-06-14 MED ORDER — SERTRALINE HCL 100 MG PO TABS: 100.0000 mg | ORAL_TABLET | Freq: Every day | ORAL | Status: DC

## 2014-06-14 MED ORDER — ROPINIROLE HCL 1 MG PO TABS: ORAL_TABLET | ORAL | Status: DC

## 2014-06-14 MED ORDER — DONEPEZIL HCL 10 MG PO TABS: 10.0000 mg | ORAL_TABLET | Freq: Every day | ORAL | Status: DC

## 2014-06-14 NOTE — Progress Notes (Signed)
Pre visit review using our clinic review tool, if applicable. No additional management support is needed unless otherwise documented below in the visit note. 

## 2014-06-14 NOTE — Assessment & Plan Note (Signed)
Asymtp, stable overall by history and exam, recent data reviewed with pt, and pt to continue medical treatment as before,  to f/u any worsening symptoms or concerns Lab Results  Component Value Date   HGBA1C 5.9 06/09/2014

## 2014-06-14 NOTE — Assessment & Plan Note (Signed)
etiollogy unclear, ? Related to aricept vs prilosec vs other, ok for change aricept to 10 qd,  Probiotic asd, exam benign, o/w  to f/u any worsening symptoms or concerns

## 2014-06-14 NOTE — Assessment & Plan Note (Signed)
stable overall by history and exam, recent data reviewed with pt, and pt to continue medical treatment as before,  to f/u any worsening symptoms or concerns Lab Results  Component Value Date   WBC 6.5 06/09/2014   HGB 14.0 06/09/2014   HCT 42.1 06/09/2014   PLT 301.0 06/09/2014   GLUCOSE 90 06/09/2014   CHOL 216* 06/09/2014   TRIG 90.0 06/09/2014   HDL 69.90 06/09/2014   LDLCALC 128* 06/09/2014   ALT 14 06/09/2014   AST 20 06/09/2014   NA 137 06/09/2014   K 4.5 06/09/2014   CL 102 06/09/2014   CREATININE 0.8 06/09/2014   BUN 14 06/09/2014   CO2 28 06/09/2014   TSH 2.35 06/09/2014   HGBA1C 5.9 06/09/2014    

## 2014-06-14 NOTE — Assessment & Plan Note (Signed)

## 2014-06-14 NOTE — Patient Instructions (Signed)
OK to decrease the aricept to 10 mg per day  OK to try Align (probiotic) OTC - 1 per day as needed  Please continue all other medications as before, and refills have been done for all medications you will need  Your daughter should help make sure of your Medication taking Every Day  Please continue your efforts at being more active, low cholesterol diet, and weight control.  You are otherwise up to date with prevention measures today.  Please keep your appointments with your specialists as you may have planned  Your blood work was OK today  Please return in 6 months, or sooner if needed

## 2014-06-14 NOTE — Assessment & Plan Note (Signed)
stable overall by history and exam, recent data reviewed with pt, and pt to continue medical treatment as before,  to f/u any worsening symptoms or concerns BP Readings from Last 3 Encounters:  06/14/14 124/72  02/13/14 120/68  01/24/14 126/78

## 2014-06-14 NOTE — Progress Notes (Signed)
Subjective:    Patient ID: Anne Snyder, female    DOB: 05/15/1927, 80 y.o.   MRN: [id number]  HPI  Here for wellness and f/u;  Overall doing ok;  Pt denies CP, worsening SOB, DOE, wheezing, orthopnea, PND, worsening LE edema, palpitations, dizziness or syncope.  Pt denies neurological change such as new headache, facial or extremity weakness.  Pt denies polydipsia, polyuria, or low sugar symptoms. Pt states overall good compliance with treatment and medications, good tolerability, and has been trying to follow lower cholesterol diet.  Pt denies worsening depressive symptoms, suicidal ideation or panic. No fever, night sweats, wt loss, loss of appetite, or other constitutional symptoms.  Pt states good ability with ADL's, has low fall risk, home safety reviewed and adequate, no other significant changes in hearing or vision, and only occasionally active with exercise.  Only taking a few medications curretnly, lives with duaghter but pt curretnly manages own meds, and now actually only taking asa, prilosec, namenda, oxybutinin, Vit D, and tumeric. Stopped all meds for last week as had 1-2 episodes of loose stools without n/v, pain, fever or blood.  Appetite ok, no recent significant wt loss Wt Readings from Last 3 Encounters:  06/14/14 101 lb 4 oz (45.927 kg)  02/13/14 100 lb (45.36 kg)  01/24/14 103 lb (46.72 kg)    Past Medical History  Diagnosis Date  . Sleep apnea   . Cancer     Breast malignancy  . Diverticulitis   . Heart disease   . Colon polyps   . Urine incontinence   . UTI (lower urinary tract infection)   . Impaired glucose tolerance 03/14/2011  . Arthritis   . CHF (congestive heart failure)   . GERD (gastroesophageal reflux disease)   . Kidney stones   . Hypertension   . Hyperlipidemia   . Myocardial infarct 03/14/2011  . Cervical disc disease    Past Surgical History  Procedure Laterality Date  . Breast biopsy  2002  . Abdominal hysterectomy  2001  . Heart attack   12/2005 and 01/2006  . By pass surgury  05/2006  . Cataract extraction  2005  . Masectomy  09/1999  . Coronary artery bypass graft      reports that she has never smoked. She does not have any smokeless tobacco history on file. She reports that she does not drink alcohol or use illicit drugs. family history includes Arthritis in her father, mother, and other; Cancer in her mother and other; Diabetes in her father and mother; Stroke in her father and mother. Allergies  Allergen Reactions  . Sulfa Antibiotics    Current Outpatient Prescriptions on File Prior to Visit  Medication Sig Dispense Refill  . aspirin 325 MG tablet Take 325 mg by mouth daily.      . Cholecalciferol (VITAMIN D) 2000 UNITS CAPS Take by mouth.    . NON FORMULARY Tumeric    . atorvastatin (LIPITOR) 40 MG tablet Take 1 tablet (40 mg total) by mouth daily. 90 tablet 3  . Calcium Carbonate (CALTRATE 600 PO) Take 1 tablet by mouth 2 (two) times daily.      . Multiple Vitamin (MULTIVITAMIN) capsule Take 1 capsule by mouth daily.    . [DISCONTINUED] tolterodine (DETROL) 2 MG tablet Take 1 tablet (2 mg total) by mouth 2 (two) times daily. 60 tablet 11   No current facility-administered medications on file prior to visit.   Review of Systems Constitutional: Negative for increased diaphoresis, other activity, appetite  or other siginficant weight change  HENT: Negative for worsening hearing loss, ear pain, facial swelling, mouth sores and neck stiffness.   Eyes: Negative for other worsening pain, redness or visual disturbance.  Respiratory: Negative for shortness of breath and wheezing.   Cardiovascular: Negative for chest pain and palpitations.  Gastrointestinal: Negative for diarrhea, blood in stool, abdominal distention or other pain Genitourinary: Negative for hematuria, flank pain or change in urine volume.  Musculoskeletal: Negative for myalgias or other joint complaints.  Skin: Negative for color change and wound.    Neurological: Negative for syncope and numbness. other than noted Hematological: Negative for adenopathy. or other swelling Psychiatric/Behavioral: Negative for hallucinations, self-injury, decreased concentration or other worsening agitation.      Objective:   Physical Exam BP 124/72 mmHg  Pulse 108  Temp(Src) 98.5 F (36.9 C) (Oral)  Ht 4\' 11"  (1.499 m)  Wt 101 lb 4 oz (45.927 kg)  BMI 20.44 kg/m2  SpO2 94% VS noted,  Constitutional: Pt is oriented to person, place, and time. Appears well-developed and well-nourished.  Head: Normocephalic and atraumatic.  Right Ear: External ear normal.  Left Ear: External ear normal.  Nose: Nose normal.  Mouth/Throat: Oropharynx is clear and moist.  Eyes: Conjunctivae and EOM are normal. Pupils are equal, round, and reactive to light.  Neck: Normal range of motion. Neck supple. No JVD present. No tracheal deviation present.  Cardiovascular: Normal rate, regular rhythm, normal heart sounds and intact distal pulses.   Pulmonary/Chest: Effort normal and breath sounds without rales or wheezing  Abdominal: Soft. Bowel sounds are normal. NT. No HSM  Musculoskeletal: Normal range of motion. Exhibits no edema.  Lymphadenopathy:  Has no cervical adenopathy.  Neurological: Pt is alert and oriented to person, place, and time. Pt has normal reflexes. No cranial nerve deficit. Motor grossly intact Skin: Skin is warm and dry. No rash noted.  Psychiatric:  Has normal mood and affect. Behavior is normal.      Assessment & Plan:

## 2014-06-15 ENCOUNTER — Telehealth: Payer: Self-pay | Admitting: Internal Medicine

## 2014-06-15 NOTE — Telephone Encounter (Signed)
emmi emailed °

## 2014-07-13 ENCOUNTER — Telehealth: Payer: Self-pay | Admitting: Internal Medicine

## 2014-07-13 NOTE — Telephone Encounter (Signed)
Pt is coming in on 12/30 to get her 2nd Prolia injection.  Do you order it??    Her 1st one was on 6/30

## 2014-08-09 ENCOUNTER — Ambulatory Visit (INDEPENDENT_AMBULATORY_CARE_PROVIDER_SITE_OTHER): Payer: Medicare Other | Admitting: *Deleted

## 2014-08-09 DIAGNOSIS — M810 Age-related osteoporosis without current pathological fracture: Secondary | ICD-10-CM

## 2014-08-09 MED ORDER — DENOSUMAB 60 MG/ML ~~LOC~~ SOLN
60.0000 mg | Freq: Once | SUBCUTANEOUS | Status: AC
  Administered 2014-08-09: 60 mg via SUBCUTANEOUS

## 2014-10-30 DIAGNOSIS — Z961 Presence of intraocular lens: Secondary | ICD-10-CM | POA: Diagnosis not present

## 2014-10-30 DIAGNOSIS — H3589 Other specified retinal disorders: Secondary | ICD-10-CM | POA: Diagnosis not present

## 2014-11-07 DIAGNOSIS — G4733 Obstructive sleep apnea (adult) (pediatric): Secondary | ICD-10-CM | POA: Diagnosis not present

## 2014-11-15 ENCOUNTER — Encounter: Payer: Self-pay | Admitting: Pulmonary Disease

## 2014-11-15 ENCOUNTER — Ambulatory Visit (INDEPENDENT_AMBULATORY_CARE_PROVIDER_SITE_OTHER): Payer: Medicare Other | Admitting: Pulmonary Disease

## 2014-11-15 VITALS — BP 110/62 | HR 78 | Temp 97.0°F | Ht 59.0 in | Wt 102.0 lb

## 2014-11-15 DIAGNOSIS — G4733 Obstructive sleep apnea (adult) (pediatric): Secondary | ICD-10-CM | POA: Diagnosis not present

## 2014-11-15 NOTE — Patient Instructions (Signed)
Please take your machine by [facility] and see if we can get the card system working. Ask them about the need to replace your mask cushion every 2 weeks.  That seems excessive to me.  Hopefully , we can get a download off your device to make sure things are working properly followup with me again in one year if doing well.

## 2014-11-15 NOTE — Assessment & Plan Note (Signed)
The patient tells me that she is been wearing her C compliantly, and continues to benefit from her device. Unfortunately, the cart in her machine is not working properly, and it is unclear if it is a cardiac problem or machine problem. I have asked the patient to contact her home care company, and we will send an order for them to trouble shoot this issue. She may need a new device.

## 2014-11-15 NOTE — Progress Notes (Signed)
   Subjective:    Patient ID: Anne Snyder, female    DOB: Aug 27, 1926, 81 y.o.   MRN: [id number]  HPI The patient comes in today for follow-up of her obstructive sleep apnea. She has been wearing C Pap compliantly by her history, but we are unable to download her device because her SD card is not working properly. She tells me that she is sleeping well with the device, and continues to feel that it is helping her.   Review of Systems  Constitutional: Negative for fever and unexpected weight change.  HENT: Negative for congestion, dental problem, ear pain, nosebleeds, postnasal drip, rhinorrhea, sinus pressure, sneezing, sore throat and trouble swallowing.   Eyes: Negative for redness and itching.  Respiratory: Negative for cough, chest tightness, shortness of breath and wheezing.   Cardiovascular: Negative for palpitations and leg swelling.  Gastrointestinal: Negative for nausea and vomiting.  Genitourinary: Negative for dysuria.  Musculoskeletal: Negative for joint swelling.  Skin: Negative for rash.  Neurological: Negative for headaches.  Hematological: Does not bruise/bleed easily.  Psychiatric/Behavioral: Negative for dysphoric mood. The patient is not nervous/anxious.        Objective:   Physical Exam Well-developed female in no acute distress Nose without purulence or discharge noted No skin breakdown or pressure necrosis from the C Pap mask Neck without lymphadenopathy or thyromegaly Lower extremities without edema, no cyanosis Alert and oriented, moves all 4 extremities.       Assessment & Plan:

## 2014-11-29 ENCOUNTER — Telehealth: Payer: Self-pay | Admitting: Pulmonary Disease

## 2014-11-29 DIAGNOSIS — G4733 Obstructive sleep apnea (adult) (pediatric): Secondary | ICD-10-CM

## 2014-11-29 NOTE — Telephone Encounter (Signed)
Daughter is aware of results. No further questions

## 2014-11-29 NOTE — Telephone Encounter (Signed)
Let pt know that her download shows good control of her sleep apnea on the current pressure.  No change.

## 2014-12-14 ENCOUNTER — Encounter: Payer: Self-pay | Admitting: Internal Medicine

## 2014-12-14 ENCOUNTER — Ambulatory Visit (INDEPENDENT_AMBULATORY_CARE_PROVIDER_SITE_OTHER): Payer: Medicare Other | Admitting: Internal Medicine

## 2014-12-14 VITALS — BP 120/68 | HR 67 | Temp 97.8°F | Resp 18 | Ht 59.0 in | Wt 100.1 lb

## 2014-12-14 DIAGNOSIS — E7800 Pure hypercholesterolemia, unspecified: Secondary | ICD-10-CM

## 2014-12-14 DIAGNOSIS — I10 Essential (primary) hypertension: Secondary | ICD-10-CM | POA: Diagnosis not present

## 2014-12-14 DIAGNOSIS — R7302 Impaired glucose tolerance (oral): Secondary | ICD-10-CM | POA: Diagnosis not present

## 2014-12-14 DIAGNOSIS — F0390 Unspecified dementia without behavioral disturbance: Secondary | ICD-10-CM

## 2014-12-14 DIAGNOSIS — Z0000 Encounter for general adult medical examination without abnormal findings: Secondary | ICD-10-CM

## 2014-12-14 NOTE — Assessment & Plan Note (Signed)
stable overall by history and exam, recent data reviewed with pt, and pt to continue medical treatment as before,  to f/u any worsening symptoms or concerns Lab Results  Component Value Date   LDLCALC 128* 06/09/2014

## 2014-12-14 NOTE — Assessment & Plan Note (Signed)
stable overall by history and exam, recent data reviewed with pt, and pt to continue medical treatment as before,  to f/u any worsening symptoms or concerns BP Readings from Last 3 Encounters:  12/14/14 120/68  11/15/14 110/62  06/14/14 124/72

## 2014-12-14 NOTE — Patient Instructions (Signed)
Please continue all other medications as before, and refills have been done if requested.  Please have the pharmacy call with any other refills you may need.  Please continue your efforts at being more active, low cholesterol diet, and weight control..  Please keep your appointments with your specialists as you may have planned  Please return in 6 months, or sooner if needed, with Lab testing done 3-5 days before  

## 2014-12-14 NOTE — Progress Notes (Signed)
Pre visit review using our clinic review tool, if applicable. No additional management support is needed unless otherwise documented below in the visit note. 

## 2014-12-14 NOTE — Progress Notes (Signed)
Subjective:    Patient ID: Anne Snyder, female    DOB: 18-Apr-1927, 81 y.o.   MRN: [id number]  HPI  Here to f/u with daughter; overall doing ok,  Pt denies chest pain, increasing sob or doe, wheezing, orthopnea, PND, increased LE swelling, palpitations, dizziness or syncope.  Pt denies new neurological symptoms such as new headache, or facial or extremity weakness or numbness.  Pt denies polydipsia, polyuria, or low sugar episode.   Pt denies new neurological symptoms such as new headache, or facial or extremity weakness or numbness.   Pt states overall good compliance with meds, mostly trying to follow appropriate diet, with wt overall stable,  but little exercise however. Dementia overall stable symptomatically and not assoc with behavioral changes such as hallucinations, paranoia, or agitation. Past Medical History  Diagnosis Date  . Sleep apnea   . Cancer     Breast malignancy  . Diverticulitis   . Heart disease   . Colon polyps   . Urine incontinence   . UTI (lower urinary tract infection)   . Impaired glucose tolerance 03/14/2011  . Arthritis   . CHF (congestive heart failure)   . GERD (gastroesophageal reflux disease)   . Kidney stones   . Hypertension   . Hyperlipidemia   . Myocardial infarct 03/14/2011  . Cervical disc disease    Past Surgical History  Procedure Laterality Date  . Breast biopsy  2002  . Abdominal hysterectomy  2001  . Heart attack  12/2005 and 01/2006  . By pass surgury  05/2006  . Cataract extraction  2005  . Masectomy  09/1999  . Coronary artery bypass graft      reports that she has never smoked. She does not have any smokeless tobacco history on file. She reports that she does not drink alcohol or use illicit drugs. family history includes Arthritis in her father, mother, and other; Cancer in her mother and other; Diabetes in her father and mother; Stroke in her father and mother. Allergies  Allergen Reactions  . Sulfa Antibiotics    Current  Outpatient Prescriptions on File Prior to Visit  Medication Sig Dispense Refill  . aspirin 325 MG tablet Take 325 mg by mouth daily.      Marland Kitchen atorvastatin (LIPITOR) 40 MG tablet Take 1 tablet (40 mg total) by mouth daily. 90 tablet 3  . carvedilol (COREG) 3.125 MG tablet Take 1 tablet by mouth  daily. 90 tablet 3  . Cholecalciferol (VITAMIN D) 2000 UNITS CAPS Take by mouth.    . donepezil (ARICEPT) 10 MG tablet Take 1 tablet (10 mg total) by mouth at bedtime. 90 tablet 3  . memantine (NAMENDA) 10 MG tablet Take 1 tablet by mouth two  times daily 180 tablet 3  . NON FORMULARY Tumeric    . omeprazole (PRILOSEC) 40 MG capsule Take 1 capsule (40 mg total) by mouth daily. 90 capsule 3  . oxybutynin (DITROPAN) 5 MG tablet Take 1 tablet by mouth  twice a day 180 tablet 3  . rOPINIRole (REQUIP) 1 MG tablet Take 1 tablet by mouth once daily at bedtime 90 tablet 1  . sertraline (ZOLOFT) 100 MG tablet Take 1 tablet (100 mg total) by mouth daily. 90 tablet 3   No current facility-administered medications on file prior to visit.    Review of Systems  Constitutional: Negative for unusual diaphoresis or night sweats HENT: Negative for ringing in ear or discharge Eyes: Negative for double vision or worsening visual disturbance.  Respiratory: Negative for choking and stridor.   Gastrointestinal: Negative for vomiting or other signifcant bowel change Genitourinary: Negative for hematuria or change in urine volume.  Musculoskeletal: Negative for other MSK pain or swelling Skin: Negative for color change and worsening wound.  Neurological: Negative for tremors and numbness other than noted  Psychiatric/Behavioral: Negative for decreased concentration or agitation other than above       Objective:   Physical Exam BP 120/68 mmHg  Pulse 67  Temp(Src) 97.8 F (36.6 C) (Oral)  Resp 18  Ht 4\' 11"  (1.499 m)  Wt 100 lb 1.3 oz (45.396 kg)  BMI 20.20 kg/m2  SpO2 97% VS noted,  Constitutional: Pt appears in  no significant distress HENT: Head: NCAT.  Right Ear: External ear normal.  Left Ear: External ear normal.  Eyes: . Pupils are equal, round, and reactive to light. Conjunctivae and EOM are normal Neck: Normal range of motion. Neck supple.  Cardiovascular: Normal rate and regular rhythm.   Pulmonary/Chest: Effort normal and breath sounds without rales or wheezing.  Abd:  Soft, NT, ND, + BS Neurological: Pt is alert. At baseline confused , motor grossly intact Skin: Skin is warm. No rash, no LE edema Psychiatric: Pt behavior is normal. No agitation.     Assessment & Plan:

## 2014-12-14 NOTE — Assessment & Plan Note (Signed)
stable overall by history and exam, recent data reviewed with pt, and pt to continue medical treatment as before,  to f/u any worsening symptoms or concerns Lab Results  Component Value Date   WBC 6.5 06/09/2014   HGB 14.0 06/09/2014   HCT 42.1 06/09/2014   PLT 301.0 06/09/2014   GLUCOSE 90 06/09/2014   CHOL 216* 06/09/2014   TRIG 90.0 06/09/2014   HDL 69.90 06/09/2014   LDLCALC 128* 06/09/2014   ALT 14 06/09/2014   AST 20 06/09/2014   NA 137 06/09/2014   K 4.5 06/09/2014   CL 102 06/09/2014   CREATININE 0.8 06/09/2014   BUN 14 06/09/2014   CO2 28 06/09/2014   TSH 2.35 06/09/2014   HGBA1C 5.9 06/09/2014

## 2014-12-14 NOTE — Assessment & Plan Note (Signed)
Asympt, stable overall by history and exam, recent data reviewed with pt, and pt to continue medical treatment as before,  to f/u any worsening symptoms or concerns Lab Results  Component Value Date   HGBA1C 5.9 06/09/2014

## 2015-01-05 ENCOUNTER — Other Ambulatory Visit: Payer: Self-pay | Admitting: Pulmonary Disease

## 2015-01-05 DIAGNOSIS — G4733 Obstructive sleep apnea (adult) (pediatric): Secondary | ICD-10-CM

## 2015-02-12 DIAGNOSIS — G4733 Obstructive sleep apnea (adult) (pediatric): Secondary | ICD-10-CM | POA: Diagnosis not present

## 2015-03-14 ENCOUNTER — Other Ambulatory Visit: Payer: Self-pay | Admitting: Internal Medicine

## 2015-03-29 ENCOUNTER — Other Ambulatory Visit: Payer: Self-pay | Admitting: Internal Medicine

## 2015-04-19 ENCOUNTER — Encounter: Payer: Self-pay | Admitting: Family Medicine

## 2015-04-19 ENCOUNTER — Ambulatory Visit (INDEPENDENT_AMBULATORY_CARE_PROVIDER_SITE_OTHER): Payer: Medicare Other | Admitting: Family Medicine

## 2015-04-19 VITALS — BP 122/80 | HR 73 | Wt 98.0 lb

## 2015-04-19 DIAGNOSIS — M7502 Adhesive capsulitis of left shoulder: Secondary | ICD-10-CM | POA: Diagnosis not present

## 2015-04-19 DIAGNOSIS — M25512 Pain in left shoulder: Secondary | ICD-10-CM | POA: Diagnosis not present

## 2015-04-19 NOTE — Progress Notes (Signed)
[facility] [name], [place] [address] Phone: [phone] Subjective:     CC: Left shoulder pain follow up.   [id number] Anne Snyder is a 81 y.o. female coming in with complaint of left shoulder pain. Patient was seen previously, and was diagnosed with calcific rotator cuff tendinopathy as well as osteoarthritic changes. Patient was also developing an adhesive capsulitis and was given an injection. Patient was seen greater than 15 months ago. Patient was doing well and has only started complaining about in the recent weeks per her daughter. Patient has been able to do most of her daily activities but states that they are starting to wake her up again at night. Patient is not taking any medicine on a regular basis.     Past medical history, social, surgical and family history all reviewed in electronic medical record.   Review of Systems: No headache, visual changes, nausea, vomiting, diarrhea, constipation, dizziness, abdominal pain, skin rash, fevers, chills, night sweats, weight loss, swollen lymph nodes, body aches, joint swelling, muscle aches, chest pain, shortness of breath, mood changes.   Objective Blood pressure 122/80, pulse 73, weight 98 lb (44.453 kg), SpO2 97 %.  General: No apparent distress alert and oriented x3 mood and affect normal, dressed appropriately.  HEENT: Pupils equal, extraocular movements intact  Respiratory: Patient's speak in full sentences and does not appear short of breath  Cardiovascular: No lower extremity edema, non tender, no erythema  Skin: Warm dry intact with no signs of infection or rash on extremities or on axial skeleton.  Abdomen: Soft nontender  Neuro: Cranial nerves II through XII are intact, neurovascularly intact in all extremities with 2+ DTRs and 2+ pulses.  Lymph: No lymphadenopathy of posterior or anterior cervical chain or axillae bilaterally.  Gait normal with good balance and  coordination.  MSK:  Non tender with full range of motion and good stability and symmetric strength and tone of shoulders, elbows, wrist, hip, knee and ankles bilaterally.  Shoulder: left Inspection reveals no abnormalities, atrophy or asymmetry. Palpation is normal with no tenderness over AC joint or bicipital groove. ROM is restricted even passively to forward flexion of the 170 external rotation of 15 internal rotation to L2. All of which are improvement from previous exam.  A full range of motion on the contralateral side.  Rotator cuff strength normal throughout. signs of impingement with  Neer and Hawkin's tests negative Speeds and Yergason's tests normal. No labral pathology noted with negative Obrien's, negative clunk and good stability. Normal scapular function observed. No painful arc and no drop arm sign. No apprehension sign Contralateral shoulder unremarkable   Procedure: Real-time Ultrasound Guided Injection of left glenohumeral joint Device: GE Logiq E  Ultrasound guided injection is preferred based studies that show increased duration, increased effect, greater accuracy, decreased procedural pain, increased response rate with ultrasound guided versus blind injection.  Verbal informed consent obtained.  Time-out conducted.  Noted no overlying erythema, induration, or other signs of local infection.  Skin prepped in a sterile fashion.  Local anesthesia: Topical Ethyl chloride.  With sterile technique and under real time ultrasound guidance:  Joint visualized.  23g 1  inch needle inserted posterior approach. Pictures taken for needle placement. Patient did have injection of 2 cc of 1% lidocaine, 2 cc of 0.5% Marcaine, and 1cc of Kenalog 40 mg/dL. Completed without difficulty  Pain immediately resolved suggesting accurate placement of the medication.  Advised to call if fevers/chills, erythema, induration,  drainage, or persistent bleeding.  Images permanently stored and  available for review in the ultrasound unit.  Impression: Technically successful ultrasound guided injection.    Impression and Recommendations:     This case required medical decision making of moderate complexity.

## 2015-04-19 NOTE — Assessment & Plan Note (Signed)
I do believe the left shoulder pain is secondary to some adhesive capsulitis but mostly secondary to the underlying osteophytic changes. Patient though does have good strength of the rotator cuff and I do not think a tears appreciated. Patient given injection today with near complete resolution of pain immediately. We discussed icing regimen. We discussed continuing the vitamin D supplementation in the topical anti-inflammatory's and was given another trial size. Patient and will come back and see me again in 4 weeks and we'll discuss further imaging is necessary. Patient does well she can follow-up as needed.

## 2015-04-19 NOTE — Patient Instructions (Signed)
Good to see you Ice is your friend pennsaid pinkie amount topically 2 times daily for 1 week then as needed Exercises 3 times a week for next 6 weeks See me again in 1 month if not a lot of improvement.

## 2015-05-02 DIAGNOSIS — Z961 Presence of intraocular lens: Secondary | ICD-10-CM | POA: Diagnosis not present

## 2015-05-02 DIAGNOSIS — H5203 Hypermetropia, bilateral: Secondary | ICD-10-CM | POA: Diagnosis not present

## 2015-05-02 DIAGNOSIS — H3589 Other specified retinal disorders: Secondary | ICD-10-CM | POA: Diagnosis not present

## 2015-05-02 DIAGNOSIS — H52203 Unspecified astigmatism, bilateral: Secondary | ICD-10-CM | POA: Diagnosis not present

## 2015-05-08 ENCOUNTER — Other Ambulatory Visit: Payer: Self-pay | Admitting: Internal Medicine

## 2015-05-15 DIAGNOSIS — G4733 Obstructive sleep apnea (adult) (pediatric): Secondary | ICD-10-CM | POA: Diagnosis not present

## 2015-05-21 ENCOUNTER — Ambulatory Visit (INDEPENDENT_AMBULATORY_CARE_PROVIDER_SITE_OTHER): Payer: Medicare Other | Admitting: Family Medicine

## 2015-05-21 ENCOUNTER — Encounter: Payer: Self-pay | Admitting: Family Medicine

## 2015-05-21 VITALS — BP 114/60 | HR 75 | Wt 97.0 lb

## 2015-05-21 DIAGNOSIS — Z23 Encounter for immunization: Secondary | ICD-10-CM | POA: Diagnosis not present

## 2015-05-21 DIAGNOSIS — M7502 Adhesive capsulitis of left shoulder: Secondary | ICD-10-CM | POA: Diagnosis not present

## 2015-05-21 NOTE — Progress Notes (Signed)
  [name] [address], [place] [address] Phone: [phone] Subjective:     CC: Left shoulder pain follow up.   [id number] Anne Snyder is a 81 y.o. female coming in with complaint of left shoulder pain. Patient was seen previously, and was diagnosed with calcific rotator cuff tendinopathy as well as osteoarthritic changes.  Injection  Did help some but not entirely. Patient had of local theme lifting greater than 90 from her side she states. Otherwise not significant pain with daily activities.    Past medical history, social, surgical and family history all reviewed in electronic medical record.   Review of Systems: No headache, visual changes, nausea, vomiting, diarrhea, constipation, dizziness, abdominal pain, skin rash, fevers, chills, night sweats, weight loss, swollen lymph nodes, body aches, joint swelling, muscle aches, chest pain, shortness of breath, mood changes.   Objective Blood pressure 114/60, pulse 75, weight 97 lb (43.999 kg), SpO2 98 %.  General: No apparent distress alert and oriented x3 mood and affect normal, dressed appropriately.  HEENT: Pupils equal, extraocular movements intact  Respiratory: Patient's speak in full sentences and does not appear short of breath  Cardiovascular: No lower extremity edema, non tender, no erythema  Skin: Warm dry intact with no signs of infection or rash on extremities or on axial skeleton.  Abdomen: Soft nontender  Neuro: Cranial nerves II through XII are intact, neurovascularly intact in all extremities with 2+ DTRs and 2+ pulses.  Lymph: No lymphadenopathy of posterior or anterior cervical chain or axillae bilaterally.  Gait normal with good balance and coordination.  MSK:  Non tender with full range of motion and good stability and symmetric strength and tone of shoulders, elbows, wrist, hip, knee and ankles bilaterally.  Shoulder: left Inspection reveals no abnormalities,  atrophy or asymmetry. Palpation is normal with no tenderness over AC joint or bicipital groove. ROM is restricted even passively to forward flexion of the 180 pin still has some mild decrease in internal and external range of motion. Patient does have significant crepitus. 4-5 strength of the rotator cuff Mild improvement from previous exam Contralateral shoulder has crepitus but full range of motion and no pain.      Impression and Recommendations:     This case required medical decision making of moderate complexity.

## 2015-05-21 NOTE — Progress Notes (Signed)
Pre visit review using our clinic review tool, if applicable. No additional management support is needed unless otherwise documented below in the visit note. 

## 2015-05-21 NOTE — Assessment & Plan Note (Signed)
More secondary second to her arthritis centrally frozen shoulder.

## 2015-05-21 NOTE — Patient Instructions (Signed)
Good to see you Ice is your friend when you need it We can repeat injection every 3 months as needed Will be worse with trying to reach over head and with something heavy.  We will get with physical therapy.  See me again when you need me

## 2015-05-21 NOTE — Assessment & Plan Note (Signed)
Patient's left shoulder pain is from arthritis. We discussed with patient that this led to be managed and not cured less patient would consider a replacement but with patient not having significant pain I would consider a conservative therapy. We discussed icing, home exercises, and patient was sent to formal physical therapy for the shoulder as well as balance and coordination. Discussed with patient about protein supplementation as well as over-the-counter natural supplementations. Patient will remain active. Patient has any worsening symptoms of the shoulder we can repeat injections every 12 weeks if necessary. This would not be curative we discussed again. All questions were answered.  Spent  25 minutes with patient face-to-face and had greater than 50% of counseling including as described above in assessment and plan.

## 2015-06-19 ENCOUNTER — Other Ambulatory Visit (INDEPENDENT_AMBULATORY_CARE_PROVIDER_SITE_OTHER): Payer: Medicare Other

## 2015-06-19 ENCOUNTER — Ambulatory Visit (INDEPENDENT_AMBULATORY_CARE_PROVIDER_SITE_OTHER): Payer: Medicare Other | Admitting: Internal Medicine

## 2015-06-19 ENCOUNTER — Encounter: Payer: Self-pay | Admitting: Internal Medicine

## 2015-06-19 VITALS — BP 106/56 | HR 95 | Temp 97.4°F | Ht 59.0 in | Wt 93.0 lb

## 2015-06-19 DIAGNOSIS — F411 Generalized anxiety disorder: Secondary | ICD-10-CM | POA: Diagnosis not present

## 2015-06-19 DIAGNOSIS — I10 Essential (primary) hypertension: Secondary | ICD-10-CM | POA: Diagnosis not present

## 2015-06-19 DIAGNOSIS — R7302 Impaired glucose tolerance (oral): Secondary | ICD-10-CM | POA: Diagnosis not present

## 2015-06-19 DIAGNOSIS — M810 Age-related osteoporosis without current pathological fracture: Secondary | ICD-10-CM

## 2015-06-19 DIAGNOSIS — R296 Repeated falls: Secondary | ICD-10-CM | POA: Insufficient documentation

## 2015-06-19 DIAGNOSIS — Z0000 Encounter for general adult medical examination without abnormal findings: Secondary | ICD-10-CM

## 2015-06-19 DIAGNOSIS — M79646 Pain in unspecified finger(s): Secondary | ICD-10-CM | POA: Insufficient documentation

## 2015-06-19 DIAGNOSIS — E7800 Pure hypercholesterolemia, unspecified: Secondary | ICD-10-CM | POA: Diagnosis not present

## 2015-06-19 DIAGNOSIS — M79644 Pain in right finger(s): Secondary | ICD-10-CM | POA: Diagnosis not present

## 2015-06-19 LAB — URINALYSIS, ROUTINE W REFLEX MICROSCOPIC
Bilirubin Urine: NEGATIVE
Hgb urine dipstick: NEGATIVE
KETONES UR: NEGATIVE
LEUKOCYTES UA: NEGATIVE
Nitrite: NEGATIVE
PH: 6.5 (ref 5.0–8.0)
RBC / HPF: NONE SEEN (ref 0–?)
Specific Gravity, Urine: 1.02 (ref 1.000–1.030)
Total Protein, Urine: NEGATIVE
URINE GLUCOSE: NEGATIVE
UROBILINOGEN UA: 0.2 (ref 0.0–1.0)

## 2015-06-19 LAB — BASIC METABOLIC PANEL
BUN: 11 mg/dL (ref 6–23)
CALCIUM: 10.8 mg/dL — AB (ref 8.4–10.5)
CO2: 33 mEq/L — ABNORMAL HIGH (ref 19–32)
Chloride: 100 mEq/L (ref 96–112)
Creatinine, Ser: 0.79 mg/dL (ref 0.40–1.20)
GFR: 73.35 mL/min (ref >60.00)
GLUCOSE: 108 mg/dL — AB (ref 70–99)
Potassium: 3.9 mEq/L (ref 3.5–5.1)
SODIUM: 141 meq/L (ref 135–145)

## 2015-06-19 LAB — CBC WITH DIFFERENTIAL/PLATELET
BASOS ABS: 0 10*3/uL (ref 0.0–0.1)
Basophils Relative: 0.4 % (ref 0.0–3.0)
EOS ABS: 0.1 10*3/uL (ref 0.0–0.7)
Eosinophils Relative: 1.8 % (ref 0.0–5.0)
HCT: 39.3 % (ref 36.0–46.0)
Hemoglobin: 13 g/dL (ref 12.0–15.0)
LYMPHS ABS: 1.6 10*3/uL (ref 0.7–4.0)
Lymphocytes Relative: 20.8 % (ref 12.0–46.0)
MCHC: 33 g/dL (ref 30.0–36.0)
MCV: 96.4 fl (ref 78.0–100.0)
MONO ABS: 0.7 10*3/uL (ref 0.1–1.0)
Monocytes Relative: 9 % (ref 3.0–12.0)
NEUTROS PCT: 68 % (ref 43.0–77.0)
Neutro Abs: 5.1 10*3/uL (ref 1.4–7.7)
PLATELETS: 419 10*3/uL — AB (ref 150.0–400.0)
RBC: 4.07 Mil/uL (ref 3.87–5.11)
RDW: 13.5 % (ref 11.5–15.5)
WBC: 7.6 10*3/uL (ref 4.0–10.5)

## 2015-06-19 LAB — HEPATIC FUNCTION PANEL
ALBUMIN: 3.8 g/dL (ref 3.5–5.2)
ALK PHOS: 81 U/L (ref 39–117)
ALT: 9 U/L (ref 0–35)
AST: 16 U/L (ref 0–37)
BILIRUBIN TOTAL: 0.4 mg/dL (ref 0.2–1.2)
Bilirubin, Direct: 0.1 mg/dL (ref 0.0–0.3)
TOTAL PROTEIN: 7.4 g/dL (ref 6.0–8.3)

## 2015-06-19 LAB — TSH: TSH: 1.12 u[IU]/mL (ref 0.35–4.50)

## 2015-06-19 LAB — LIPID PANEL
CHOL/HDL RATIO: 4
CHOLESTEROL: 215 mg/dL — AB (ref 0–200)
HDL: 56.6 mg/dL (ref >39.00)
LDL CALC: 141 mg/dL — AB (ref 0–99)
NonHDL: 158.42
Triglycerides: 87 mg/dL (ref 0.0–149.0)
VLDL: 17.4 mg/dL (ref 0.0–40.0)

## 2015-06-19 LAB — HEMOGLOBIN A1C: Hgb A1c MFr Bld: 6.1 % (ref 4.6–6.5)

## 2015-06-19 MED ORDER — SERTRALINE HCL 100 MG PO TABS: ORAL_TABLET | ORAL | Status: DC

## 2015-06-19 NOTE — Patient Instructions (Signed)
OK to increase the zoloft to 150 mg per day (one and 1/2 pills every day)  Please increase the Ensure to 3 cans per day  Please continue all other medications as before, and refills have been done if requested.  Please have the pharmacy call with any other refills you may need.  Please continue your efforts at being more active, low cholesterol diet, and weight control.  You will be contacted regarding the referral for: Hand surgeon for the thumb pain, and Physical Therapy for the falls  You should also be contacted about the [place] per Deirdre Peer.  You are otherwise up to date with prevention measures today.  Please keep your appointments with your specialists as you may have planned  Please go to the LAB in the Basement (turn left off the elevator) for the tests to be done today  You will be contacted by phone if any changes need to be made immediately.  Otherwise, you will receive a letter about your results with an explanation, but please check with MyChart first.  Please remember to sign up for MyChart if you have not done so, as this will be important to you in the future with finding out test results, communicating by private email, and scheduling acute appointments online when needed.  Please return in 6 months, or sooner if needed

## 2015-06-19 NOTE — Assessment & Plan Note (Signed)
Will address with office staff to coordinate re-start prolia,  to f/u any worsening symptoms or concerns

## 2015-06-19 NOTE — Progress Notes (Signed)
Subjective:    Patient ID: Anne Snyder, female    DOB: 10-23-1926, 81 y.o.   MRN: [id number]  HPI  Here for wellness and f/u;  Overall doing ok;  Pt denies Chest pain, worsening SOB, DOE, wheezing, orthopnea, PND, worsening LE edema, palpitations, dizziness or syncope.  Pt denies neurological change such as new headache, facial or extremity weakness.  Pt denies polydipsia, polyuria, or low sugar symptoms. Pt states overall good compliance with treatment and medications, good tolerability, and has been trying to follow appropriate diet.  Pt has mild worsening depressive symptoms, but no suicidal ideation or panic. No fever, night sweats, wt loss, loss of appetite, or other constitutional symptoms.  Pt states good ability with ADL's, has low fall risk, home safety reviewed and adequate, no other significant changes in hearing or vision, and only occasionally active with exercise.  Has infreq falls, has cane at home. Has lost several lbs with worsening mood recently.  Balance has been worsneing issue. Wt Readings from Last 3 Encounters:  06/19/15 93 lb (42.185 kg)  05/21/15 97 lb (43.999 kg)  04/19/15 98 lb (44.453 kg)  missed prolia. In June 2016  Has seemed more unsteady in gait 2 wks ago, then back to baseline., Also episode garbled speech - nonsensial noted during conversation with daughter - lasted 3-4 sec only.  No recurrrence.   Past Medical History  Diagnosis Date  . Sleep apnea   . Cancer [facility])     Breast malignancy  . Diverticulitis   . Heart disease   . Colon polyps   . Urine incontinence   . UTI (lower urinary tract infection)   . Impaired glucose tolerance 03/14/2011  . Arthritis   . CHF (congestive heart failure) ([place])   . GERD (gastroesophageal reflux disease)   . Kidney stones   . Hypertension   . Hyperlipidemia   . Myocardial infarct ([place]) 03/14/2011  . Cervical disc disease    Past Surgical History  Procedure Laterality Date  . Breast biopsy  2002  . Abdominal  hysterectomy  2001  . Heart attack  12/2005 and 01/2006  . By pass surgury  05/2006  . Cataract extraction  2005  . Masectomy  09/1999  . Coronary artery bypass graft      reports that she has never smoked. She does not have any smokeless tobacco history on file. She reports that she does not drink alcohol or use illicit drugs. family history includes Arthritis in her father, mother, and other; Cancer in her mother and other; Diabetes in her father and mother; Stroke in her father and mother. Allergies  Allergen Reactions  . Sulfa Antibiotics    Current Outpatient Prescriptions on File Prior to Visit  Medication Sig Dispense Refill  . aspirin 325 MG tablet Take 325 mg by mouth daily.      Marland Kitchen atorvastatin (LIPITOR) 40 MG tablet Take 1 tablet by mouth  daily 90 tablet 1  . carvedilol (COREG) 3.125 MG tablet Take 1 tablet by mouth  daily 90 tablet 0  . Cholecalciferol (VITAMIN D) 2000 UNITS CAPS Take by mouth.    . donepezil (ARICEPT) 10 MG tablet Take 1 tablet by mouth at  bedtime 90 tablet 1  . memantine (NAMENDA) 10 MG tablet Take 1 tablet by mouth two  times daily 180 tablet 3  . NON FORMULARY Tumeric    . omeprazole (PRILOSEC) 40 MG capsule Take 1 capsule by mouth  daily 90 capsule 0  . oxybutynin (  DITROPAN) 5 MG tablet Take 1 tablet by mouth  twice a day 180 tablet 3  . rOPINIRole (REQUIP) 1 MG tablet Take 1 tablet by mouth once daily at bedtime 90 tablet 1  . sertraline (ZOLOFT) 100 MG tablet Take 1 tablet by mouth  daily 90 tablet 0   No current facility-administered medications on file prior to visit.  c/o Hand arthritis pain Past Medical History  Diagnosis Date  . Sleep apnea   . Cancer [facility])     Breast malignancy  . Diverticulitis   . Heart disease   . Colon polyps   . Urine incontinence   . UTI (lower urinary tract infection)   . Impaired glucose tolerance 03/14/2011  . Arthritis   . CHF (congestive heart failure) ([place])   . GERD (gastroesophageal reflux disease)   .  Kidney stones   . Hypertension   . Hyperlipidemia   . Myocardial infarct ([place]) 03/14/2011  . Cervical disc disease    Past Surgical History  Procedure Laterality Date  . Breast biopsy  2002  . Abdominal hysterectomy  2001  . Heart attack  12/2005 and 01/2006  . By pass surgury  05/2006  . Cataract extraction  2005  . Masectomy  09/1999  . Coronary artery bypass graft      reports that she has never smoked. She does not have any smokeless tobacco history on file. She reports that she does not drink alcohol or use illicit drugs. family history includes Arthritis in her father, mother, and other; Cancer in her mother and other; Diabetes in her father and mother; Stroke in her father and mother. Allergies  Allergen Reactions  . Sulfa Antibiotics    Current Outpatient Prescriptions on File Prior to Visit  Medication Sig Dispense Refill  . aspirin 325 MG tablet Take 325 mg by mouth daily.      Marland Kitchen atorvastatin (LIPITOR) 40 MG tablet Take 1 tablet by mouth  daily 90 tablet 1  . carvedilol (COREG) 3.125 MG tablet Take 1 tablet by mouth  daily 90 tablet 0  . Cholecalciferol (VITAMIN D) 2000 UNITS CAPS Take by mouth.    . donepezil (ARICEPT) 10 MG tablet Take 1 tablet by mouth at  bedtime 90 tablet 1  . memantine (NAMENDA) 10 MG tablet Take 1 tablet by mouth two  times daily 180 tablet 3  . NON FORMULARY Tumeric    . omeprazole (PRILOSEC) 40 MG capsule Take 1 capsule by mouth  daily 90 capsule 0  . oxybutynin (DITROPAN) 5 MG tablet Take 1 tablet by mouth  twice a day 180 tablet 3  . rOPINIRole (REQUIP) 1 MG tablet Take 1 tablet by mouth once daily at bedtime 90 tablet 1  . sertraline (ZOLOFT) 100 MG tablet Take 1 tablet by mouth  daily 90 tablet 0   No current facility-administered medications on file prior to visit.    Review of Systems Constitutional: Negative for increased diaphoresis, other activity, appetite or siginficant weight change other than noted HENT: Negative for worsening  hearing loss, ear pain, facial swelling, mouth sores and neck stiffness.   Eyes: Negative for other worsening pain, redness or visual disturbance.  Respiratory: Negative for shortness of breath and wheezing  Cardiovascular: Negative for chest pain and palpitations.  Gastrointestinal: Negative for diarrhea, blood in stool, abdominal distention or other pain Genitourinary: Negative for hematuria, flank pain or change in urine volume.  Musculoskeletal: Negative for myalgias or other joint complaints.  Skin: Negative for color change  and wound or drainage.  Neurological: Negative for syncope and numbness. other than noted Hematological: Negative for adenopathy. or other swelling Psychiatric/Behavioral: Negative for hallucinations, SI, self-injury, decreased concentration or other worsening agitation.      Objective:   Physical Exam VS noted,  Constitutional: Pt is oriented to person, place, and time. Appears well-developed and well-nourished, in no significant distress Head: Normocephalic and atraumatic.  Right Ear: External ear normal.  Left Ear: External ear normal.  Nose: Nose normal.  Mouth/Throat: Oropharynx is clear and moist.  Eyes: Conjunctivae and EOM are normal. Pupils are equal, round, and reactive to light.  Neck: Normal range of motion. Neck supple. No JVD present. No tracheal deviation present or significant neck LA or mass Cardiovascular: Normal rate, regular rhythm, normal heart sounds and intact distal pulses.   Pulmonary/Chest: Effort normal and breath sounds without rales or wheezing  Abdominal: Soft. Bowel sounds are normal. NT. No HSM  Musculoskeletal: Normal range of motion. Exhibits no edema.  Lymphadenopathy:  Has no cervical adenopathy.  Neurological: Pt is alert and oriented to person only, at baseline confusion. Pt has normal reflexes. No cranial nerve deficit. Motor grossly intact, frail, unsteady gait Skin: Skin is warm and dry. No rash noted.  Psychiatric:   Has nervous mood and affect. Behavior is normal.  Tender right CMC (right thumb     Assessment & Plan:

## 2015-06-19 NOTE — Progress Notes (Signed)
Pre visit review using our clinic review tool, if applicable. No additional management support is needed unless otherwise documented below in the visit note. 

## 2015-06-19 NOTE — Assessment & Plan Note (Signed)
Mild to mod but persistent and bothersome, for hand surgury referral,  to f/u any worsening symptoms or concerns

## 2015-06-19 NOTE — Assessment & Plan Note (Signed)

## 2015-06-19 NOTE — Assessment & Plan Note (Signed)
Mild uncontrolled, to increase the zoloft to 150 qd

## 2015-06-19 NOTE — Assessment & Plan Note (Signed)
Gait disorder likely mutlifactorial, for PT eval and tx

## 2015-06-20 ENCOUNTER — Telehealth: Payer: Self-pay | Admitting: Internal Medicine

## 2015-06-20 NOTE — Telephone Encounter (Signed)
I have electronically submitted pt's info for Prolia and will notify you once I have a response. Thank you.

## 2015-06-20 NOTE — Telephone Encounter (Signed)
Thanks. JWJ

## 2015-06-25 ENCOUNTER — Telehealth: Payer: Self-pay | Admitting: Internal Medicine

## 2015-06-25 NOTE — Telephone Encounter (Signed)
I have rec'd Anne Snyder's insurance verification for Prolia and she will have an estimated responsibility of $0.  Please advise her this is an estimate and we will not know an exact amt until insurance has paid.  I have sent a copy of the summary of benefits to be scanned into her chart.  Please let me know if you have any questions. Thank you.

## 2015-06-25 NOTE — Telephone Encounter (Signed)
Pt called and wanted to know if Dr Jenny Reichmann would put a order in for her to get an xray on the place she thought was arthritis???

## 2015-06-26 DIAGNOSIS — M19031 Primary osteoarthritis, right wrist: Secondary | ICD-10-CM | POA: Diagnosis not present

## 2015-06-26 DIAGNOSIS — S63501A Unspecified sprain of right wrist, initial encounter: Secondary | ICD-10-CM | POA: Diagnosis not present

## 2015-06-26 NOTE — Telephone Encounter (Signed)
Does this mean we will proceed? Just clarifying, thanks

## 2015-06-26 NOTE — Telephone Encounter (Signed)
The Hand surgeon would most likely do this when seen, so I would not do now, but Ok to wait for that visit. thanks

## 2015-06-26 NOTE — Telephone Encounter (Signed)
Pt is requesting x-ray of thumb, please advise

## 2015-06-27 ENCOUNTER — Telehealth: Payer: Self-pay

## 2015-06-27 NOTE — Telephone Encounter (Signed)
Yes, it is ok to proceed.  I'm not sure who orders your medication, but I would let them know so they can get it in stock. Thank you.

## 2015-06-27 NOTE — Telephone Encounter (Signed)
OK , I have not been responsible in the process before to do this, but I will forward to tamara RN to help, thanks

## 2015-06-27 NOTE — Telephone Encounter (Signed)
Message left on voicemail for patient to call Daelen Belvedere back (to discuss prolia injection)----if patient calls back, she needs to talk with Yoland Scherr

## 2015-06-27 NOTE — Telephone Encounter (Signed)
Med ordered, patient advised-----routing to dr Jenny Reichmann, Juluis Rainier...Marland KitchenMarland Kitchen

## 2015-06-28 NOTE — Telephone Encounter (Signed)
Pt advised via home VM 

## 2015-06-29 ENCOUNTER — Other Ambulatory Visit: Payer: Self-pay | Admitting: Internal Medicine

## 2015-07-12 NOTE — Telephone Encounter (Signed)
Ok for Anne Snyder to let pt Daughter know , as the patient has dementia, and ask if still ok to have this done  If so, I think then it would need to be coordinated with [name]

## 2015-07-12 NOTE — Telephone Encounter (Signed)
[employer] has sent another verification and states the info I sent you on 06/25/2015 was incorrect.  The corrected verification says patient will have an estimated responsibility of $195.  You may want to notify her before her injection on 07/20/2015.  I am so sorry for this error, I was going by [employer]'s summary of benefits.  I have sent a copy of their corrected verification to be scanned into her chart.  If pt cannot afford $195 for her injection, please advise her to contact [employer] at [phone] and select option #1 to see if she qualifies for one of their assistance programs.  If she qualifies they will instruct her how to proceed.  If you have any questions, please let me know. Thank you.

## 2015-07-17 DIAGNOSIS — M654 Radial styloid tenosynovitis [de Quervain]: Secondary | ICD-10-CM | POA: Diagnosis not present

## 2015-07-18 DIAGNOSIS — Z7409 Other reduced mobility: Secondary | ICD-10-CM | POA: Diagnosis not present

## 2015-07-20 ENCOUNTER — Ambulatory Visit (INDEPENDENT_AMBULATORY_CARE_PROVIDER_SITE_OTHER): Payer: Medicare Other

## 2015-07-20 DIAGNOSIS — M810 Age-related osteoporosis without current pathological fracture: Secondary | ICD-10-CM | POA: Diagnosis not present

## 2015-07-20 MED ORDER — DENOSUMAB 60 MG/ML ~~LOC~~ SOLN
60.0000 mg | Freq: Once | SUBCUTANEOUS | Status: AC
  Administered 2015-07-20: 60 mg via SUBCUTANEOUS

## 2015-07-23 DIAGNOSIS — Z7409 Other reduced mobility: Secondary | ICD-10-CM | POA: Diagnosis not present

## 2015-07-24 ENCOUNTER — Telehealth: Payer: Self-pay | Admitting: Pulmonary Disease

## 2015-07-24 DIAGNOSIS — G4733 Obstructive sleep apnea (adult) (pediatric): Secondary | ICD-10-CM

## 2015-07-24 NOTE — Telephone Encounter (Signed)
LMTCB x1 for [employer]

## 2015-07-24 NOTE — Telephone Encounter (Signed)
Pt's daughter Thayer Headings returned call - [phone]

## 2015-07-24 NOTE — Telephone Encounter (Signed)
Called and spoke with pt's daughter  Pt if former [facility] patient who was last seen in office 11/15/2014 with no upcoming appt Daughter states having an ongoing problems with [employer] She states that company is not shipping out her mother's orders as directed and she is constantly running out of supplies Daughter is requesting for pt to switch to [facility] Informed daughter that since [facility] is no longer with practice that I would have to get another physician to approve placing the order  Dr Halford Chessman, are you ok with this order being placed under your name? Please advise. Thanks

## 2015-07-24 NOTE — Telephone Encounter (Signed)
Can send order to change to [facility] for her DME.

## 2015-07-25 NOTE — Telephone Encounter (Signed)
Spoke with pt's daughter, Thayer Headings. She is aware that we will change the pt's DME. Order will be placed. Nothing further was needed at this time.

## 2015-07-26 ENCOUNTER — Telehealth: Payer: Self-pay | Admitting: Pulmonary Disease

## 2015-07-26 DIAGNOSIS — H43822 Vitreomacular adhesion, left eye: Secondary | ICD-10-CM | POA: Diagnosis not present

## 2015-07-26 DIAGNOSIS — H35341 Macular cyst, hole, or pseudohole, right eye: Secondary | ICD-10-CM | POA: Diagnosis not present

## 2015-07-26 DIAGNOSIS — Z7409 Other reduced mobility: Secondary | ICD-10-CM | POA: Diagnosis not present

## 2015-07-26 DIAGNOSIS — G4733 Obstructive sleep apnea (adult) (pediatric): Secondary | ICD-10-CM

## 2015-07-26 DIAGNOSIS — H353131 Nonexudative age-related macular degeneration, bilateral, early dry stage: Secondary | ICD-10-CM | POA: Diagnosis not present

## 2015-07-26 DIAGNOSIS — Z961 Presence of intraocular lens: Secondary | ICD-10-CM | POA: Diagnosis not present

## 2015-07-26 NOTE — Telephone Encounter (Signed)
New order sent to [facility] 

## 2015-07-31 DIAGNOSIS — Z7409 Other reduced mobility: Secondary | ICD-10-CM | POA: Diagnosis not present

## 2015-08-02 DIAGNOSIS — Z7409 Other reduced mobility: Secondary | ICD-10-CM | POA: Diagnosis not present

## 2015-08-07 DIAGNOSIS — G4733 Obstructive sleep apnea (adult) (pediatric): Secondary | ICD-10-CM | POA: Diagnosis not present

## 2015-08-07 DIAGNOSIS — M654 Radial styloid tenosynovitis [de Quervain]: Secondary | ICD-10-CM | POA: Diagnosis not present

## 2015-08-08 DIAGNOSIS — Z7409 Other reduced mobility: Secondary | ICD-10-CM | POA: Diagnosis not present

## 2015-08-10 DIAGNOSIS — Z7409 Other reduced mobility: Secondary | ICD-10-CM | POA: Diagnosis not present

## 2015-08-12 ENCOUNTER — Other Ambulatory Visit: Payer: Self-pay | Admitting: Internal Medicine

## 2015-08-14 DIAGNOSIS — G4733 Obstructive sleep apnea (adult) (pediatric): Secondary | ICD-10-CM | POA: Diagnosis not present

## 2015-08-15 DIAGNOSIS — G4733 Obstructive sleep apnea (adult) (pediatric): Secondary | ICD-10-CM | POA: Diagnosis not present

## 2015-08-15 DIAGNOSIS — Z7409 Other reduced mobility: Secondary | ICD-10-CM | POA: Diagnosis not present

## 2015-08-17 DIAGNOSIS — Z7409 Other reduced mobility: Secondary | ICD-10-CM | POA: Diagnosis not present

## 2015-09-02 ENCOUNTER — Other Ambulatory Visit: Payer: Self-pay | Admitting: Internal Medicine

## 2015-09-19 DIAGNOSIS — G4733 Obstructive sleep apnea (adult) (pediatric): Secondary | ICD-10-CM | POA: Diagnosis not present

## 2015-10-04 DIAGNOSIS — S60212A Contusion of left wrist, initial encounter: Secondary | ICD-10-CM | POA: Diagnosis not present

## 2015-10-04 DIAGNOSIS — M654 Radial styloid tenosynovitis [de Quervain]: Secondary | ICD-10-CM | POA: Diagnosis not present

## 2015-10-18 ENCOUNTER — Other Ambulatory Visit: Payer: Self-pay | Admitting: Internal Medicine

## 2015-11-15 DIAGNOSIS — G4733 Obstructive sleep apnea (adult) (pediatric): Secondary | ICD-10-CM | POA: Diagnosis not present

## 2015-11-24 ENCOUNTER — Other Ambulatory Visit: Payer: Self-pay | Admitting: Internal Medicine

## 2015-11-26 NOTE — Telephone Encounter (Signed)
Please advise can we refill this medication

## 2015-11-27 ENCOUNTER — Ambulatory Visit: Payer: Medicare Other | Admitting: Pulmonary Disease

## 2015-12-18 ENCOUNTER — Ambulatory Visit: Payer: Medicare Other | Admitting: Internal Medicine

## 2015-12-18 DIAGNOSIS — G4733 Obstructive sleep apnea (adult) (pediatric): Secondary | ICD-10-CM | POA: Diagnosis not present

## 2015-12-28 ENCOUNTER — Telehealth: Payer: Self-pay

## 2015-12-28 NOTE — Telephone Encounter (Signed)
Patient's last prolia injection was 07/20/15----ok to give prolia anytime after June 9th---patient advised, patient requested to get prolia injection during her office visit with dr Jenny Reichmann on 01/30/16

## 2015-12-28 NOTE — Telephone Encounter (Signed)
Called and left message for patient to call back to schedule nurse visit to get prolia injection----insurance has been verified and patient has estimated $250 copay----can talk with Audryanna Zurita if any questions

## 2015-12-28 NOTE — Telephone Encounter (Signed)
Please call patient back in regards.  She states daughter states she can have done on the same day as her appointment with Dr. Jenny Reichmann but patient states she has always had to wait until the last day of the month.  Ph# [phone]

## 2016-01-09 ENCOUNTER — Telehealth: Payer: Self-pay

## 2016-01-09 NOTE — Telephone Encounter (Signed)
Left message advising patient that insurance has been verified for prolia injection---estimated cost of $225 copay---needs to schedule nurse visit to get prolia injection--any questions, can talk with Fabienne Nolasco

## 2016-01-09 NOTE — Telephone Encounter (Signed)
Patient needs to schedule prolia injection on or after January 19, 2016

## 2016-01-29 ENCOUNTER — Ambulatory Visit: Payer: Medicare Other | Admitting: Internal Medicine

## 2016-01-30 ENCOUNTER — Encounter: Payer: Self-pay | Admitting: Internal Medicine

## 2016-01-30 ENCOUNTER — Ambulatory Visit (INDEPENDENT_AMBULATORY_CARE_PROVIDER_SITE_OTHER): Payer: Medicare Other | Admitting: Internal Medicine

## 2016-01-30 VITALS — BP 100/62 | HR 78 | Temp 98.3°F | Resp 20 | Wt 93.0 lb

## 2016-01-30 DIAGNOSIS — Z0001 Encounter for general adult medical examination with abnormal findings: Secondary | ICD-10-CM

## 2016-01-30 DIAGNOSIS — I10 Essential (primary) hypertension: Secondary | ICD-10-CM

## 2016-01-30 DIAGNOSIS — R7302 Impaired glucose tolerance (oral): Secondary | ICD-10-CM

## 2016-01-30 DIAGNOSIS — E7800 Pure hypercholesterolemia, unspecified: Secondary | ICD-10-CM | POA: Diagnosis not present

## 2016-01-30 DIAGNOSIS — R296 Repeated falls: Secondary | ICD-10-CM

## 2016-01-30 DIAGNOSIS — R6889 Other general symptoms and signs: Secondary | ICD-10-CM

## 2016-01-30 NOTE — Progress Notes (Signed)
Pre visit review using our clinic review tool, if applicable. No additional management support is needed unless otherwise documented below in the visit note. 

## 2016-01-30 NOTE — Patient Instructions (Signed)
Please continue all other medications as before, and refills have been done if requested.  Please have the pharmacy call with any other refills you may need.  Please continue your efforts at being more active, low cholesterol diet, and weight control.  You are otherwise up to date with prevention measures today.  Please keep your appointments with your specialists as you may have planned  Please return in 6 months, or sooner if needed, with Lab testing done 3-5 days before  

## 2016-01-30 NOTE — Progress Notes (Signed)
Subjective:    Patient ID: Anne Snyder, female    DOB: Aug 27, 1926, 82 y.o.   MRN: [id number]  HPI    Here with daughter, with  2 wks left lateral hip pain sore achiness dull type, mild, constant after fall in yard, no bruise or swelling she is aware. No LBP or other LLE pain, difficulty with ambulation of other falls.  Better to sit, nothing else makes better or worse.  Pt denies chest pain, increased sob or doe, wheezing, orthopnea, PND, increased LE swelling, palpitations, dizziness or syncope.  Pt denies new neurological symptoms such as new headache, or facial or extremity weakness or numbness   Pt denies polydipsia, polyuria. Past Medical History  Diagnosis Date  . Sleep apnea   . Cancer [facility])     Breast malignancy  . Diverticulitis   . Heart disease   . Colon polyps   . Urine incontinence   . UTI (lower urinary tract infection)   . Impaired glucose tolerance 03/14/2011  . Arthritis   . CHF (congestive heart failure) ([place])   . GERD (gastroesophageal reflux disease)   . Kidney stones   . Hypertension   . Hyperlipidemia   . Myocardial infarct ([place]) 03/14/2011  . Cervical disc disease    Past Surgical History  Procedure Laterality Date  . Breast biopsy  2002  . Abdominal hysterectomy  2001  . Heart attack  12/2005 and 01/2006  . By pass surgury  05/2006  . Cataract extraction  2005  . Masectomy  09/1999  . Coronary artery bypass graft      reports that she has never smoked. She does not have any smokeless tobacco history on file. She reports that she does not drink alcohol or use illicit drugs. family history includes Arthritis in her father, mother, and other; Cancer in her mother and other; Diabetes in her father and mother; Stroke in her father and mother. Allergies  Allergen Reactions  . Sulfa Antibiotics    Current Outpatient Prescriptions on File Prior to Visit  Medication Sig Dispense Refill  . aspirin 325 MG tablet Take 325 mg by mouth daily.      Marland Kitchen  atorvastatin (LIPITOR) 40 MG tablet Take 1 tablet by mouth  daily 90 tablet 1  . carvedilol (COREG) 3.125 MG tablet Take 1 tablet by mouth  daily 90 tablet 2  . Cholecalciferol (VITAMIN D) 2000 UNITS CAPS Take by mouth.    . donepezil (ARICEPT) 10 MG tablet Take 1 tablet by mouth  every night at bedtime 90 tablet 2  . memantine (NAMENDA) 10 MG tablet Take 1 tablet by mouth two  times daily 180 tablet 1  . NON FORMULARY Tumeric    . omeprazole (PRILOSEC) 40 MG capsule Take 1 capsule by mouth  daily 90 capsule 3  . oxybutynin (DITROPAN) 5 MG tablet Take 1 tablet by mouth  twice a day 180 tablet 0  . rOPINIRole (REQUIP) 1 MG tablet Take 1 tablet by mouth  every night at bedtime 90 tablet 2  . sertraline (ZOLOFT) 100 MG tablet Take one and 1/2 tabs per day 135 tablet 3  . vitamin B-12 (CYANOCOBALAMIN) 1000 MCG tablet Take 1,000 mcg by mouth daily.     No current facility-administered medications on file prior to visit.   Review of Systems  Constitutional: Negative for unusual diaphoresis or night sweats HENT: Negative for ear swelling or discharge Eyes: Negative for worsening visual haziness  Respiratory: Negative for choking and stridor.  Gastrointestinal: Negative for distension or worsening eructation Genitourinary: Negative for retention or change in urine volume.  Musculoskeletal: Negative for other MSK pain or swelling Skin: Negative for color change and worsening wound Neurological: Negative for tremors and numbness other than noted  Psychiatric/Behavioral: Negative for decreased concentration or agitation other than above       Objective:   Physical Exam BP 100/62 mmHg  Pulse 78  Temp(Src) 98.3 F (36.8 C) (Oral)  Resp 20  Wt 93 lb (42.185 kg)  SpO2 96% VS noted,  Constitutional: Pt appears in no apparent distress HENT: Head: NCAT.  Right Ear: External ear normal.  Left Ear: External ear normal.  Eyes: . Pupils are equal, round, and reactive to light. Conjunctivae and  EOM are normal Neck: Normal range of motion. Neck supple.  Cardiovascular: Normal rate and regular rhythm.   Pulmonary/Chest: Effort normal and breath sounds without rales or wheezing.  Abd:  Soft, NT, ND, + BS Neurological: Pt is alert. Not confused , motor grossly intact Skin: Skin is warm. No rash, no LE edema Psychiatric: Pt behavior is normal. No agitation.  Left lateral hip neg for tender, swelling, rash, ulcer    Assessment & Plan:

## 2016-02-03 NOTE — Assessment & Plan Note (Signed)
stable overall by history and exam, recent data reviewed with pt, and pt to continue medical treatment as before,  to f/u any worsening symptoms or concerns Lab Results  Component Value Date   HGBA1C 6.1 06/19/2015

## 2016-02-03 NOTE — Assessment & Plan Note (Signed)
With recent pain to the left hip now improved, for tylenol prn, declines PT

## 2016-02-03 NOTE — Assessment & Plan Note (Signed)
stable overall by history and exam, recent data reviewed with pt and daughter, and pt to continue medical treatment as before,  to f/u any worsening symptoms or concerns, declines statin, for lower chol diet Lab Results  Component Value Date   LDLCALC 141* 06/19/2015

## 2016-02-03 NOTE — Assessment & Plan Note (Signed)
stable overall by history and exam, recent data reviewed with pt, and pt to continue medical treatment as before,  to f/u any worsening symptoms or concerns BP Readings from Last 3 Encounters:  01/30/16 100/62  06/19/15 106/56  05/21/15 114/60

## 2016-02-25 DIAGNOSIS — G4733 Obstructive sleep apnea (adult) (pediatric): Secondary | ICD-10-CM | POA: Diagnosis not present

## 2016-02-27 ENCOUNTER — Ambulatory Visit (INDEPENDENT_AMBULATORY_CARE_PROVIDER_SITE_OTHER): Payer: Medicare Other | Admitting: Pulmonary Disease

## 2016-02-27 ENCOUNTER — Encounter: Payer: Self-pay | Admitting: Pulmonary Disease

## 2016-02-27 ENCOUNTER — Telehealth: Payer: Self-pay

## 2016-02-27 VITALS — BP 118/62 | HR 73 | Ht 59.0 in | Wt 93.6 lb

## 2016-02-27 DIAGNOSIS — R682 Dry mouth, unspecified: Secondary | ICD-10-CM

## 2016-02-27 DIAGNOSIS — Z9989 Dependence on other enabling machines and devices: Secondary | ICD-10-CM

## 2016-02-27 DIAGNOSIS — G4733 Obstructive sleep apnea (adult) (pediatric): Secondary | ICD-10-CM

## 2016-02-27 NOTE — Telephone Encounter (Signed)
Left message for patient to call back----patient was charged $250 copay for prolia injection on 6/21 (should have been given in office visit with dr Jenny Reichmann on 6/21)---i'm not showing injection was given during that office visit but $250 copay was taken at front desk----patient needs to talk with Issaiah Seabrooks

## 2016-02-27 NOTE — Patient Instructions (Signed)
Try increasing your CPAP humidifier setting  Will arrange for chin strap  If mouth dryness continues, then call our office  Follow up in 1 year

## 2016-02-27 NOTE — Telephone Encounter (Signed)
Patient called Anne Snyder back about prolia injection not given on 6/21 in office visit---patient does not drive and depends on daughter for transportation---daughter, Thayer Headings will be calling Saumya Hukill back to see if we can work out a good time for daughter to bring patient back for prolia injection---copay $250 was taken on 6/21 but patient did not get injection during office visit

## 2016-02-27 NOTE — Progress Notes (Signed)
Current Outpatient Prescriptions on File Prior to Visit  Medication Sig  . aspirin 325 MG tablet Take 325 mg by mouth daily.    Marland Kitchen atorvastatin (LIPITOR) 40 MG tablet Take 1 tablet by mouth  daily  . carvedilol (COREG) 3.125 MG tablet Take 1 tablet by mouth  daily  . Cholecalciferol (VITAMIN D) 2000 UNITS CAPS Take by mouth.  . donepezil (ARICEPT) 10 MG tablet Take 1 tablet by mouth  every night at bedtime  . memantine (NAMENDA) 10 MG tablet Take 1 tablet by mouth two  times daily  . NON FORMULARY Tumeric  . omeprazole (PRILOSEC) 40 MG capsule Take 1 capsule by mouth  daily  . oxybutynin (DITROPAN) 5 MG tablet Take 1 tablet by mouth  twice a day  . rOPINIRole (REQUIP) 1 MG tablet Take 1 tablet by mouth  every night at bedtime  . sertraline (ZOLOFT) 100 MG tablet Take one and 1/2 tabs per day  . vitamin B-12 (CYANOCOBALAMIN) 1000 MCG tablet Take 1,000 mcg by mouth daily.   No current facility-administered medications on file prior to visit.     Chief Complaint  Patient presents with  . Follow-up    Former [place] pt: Wears CPAP nightly. Denies problems with mask/pressure. DME: [facility]      Sleep tests PSG 02/10/07 >> AHI 64  Past medical hx Breast cancer, Diverticulitis, GERD, Nephrolithiasis, HTN, HLD, CAD s/p CABG  Past surgical hx, Allergies, Family hx, Social hx all reviewed.  Vital Signs BP 118/62 mmHg  Pulse 73  Ht 4\' 11"  (1.499 m)  Wt 93 lb 9.6 oz (42.457 kg)  BMI 18.89 kg/m2  SpO2 96%  History of Present Illness Anne Snyder is a 82 y.o. female with obstructive sleep apnea.  She has nasal mask.  She feels CPAP helps her sleep and daytime alertness.  No issues with pressure setting.  She has noticed dry mouth.  Her machine always has left over water in humidifier.  Physical Exam  General - No distress ENT - No sinus tenderness, no oral exudate, no LAN, MP 3, enlarged tongue Cardiac - s1s2 regular, no murmur Chest - No wheeze/rales/dullness Back - No focal  tenderness Abd - Soft, non-tender Ext - No edema Neuro - Normal strength Skin - No rashes Psych - normal mood, and behavior   Assessment/Plan  Obstructive sleep apnea. - continue CPAP 14 cm H2O  Dry mouth. - will have her get chin strap for nasal mask and increased temperature on humidifier - if no improvement, then can try changing to full face mask - also could consider decreasing CPAP to 12 cm H2O   Patient Instructions  Try increasing your CPAP humidifier setting  Will arrange for chin strap  If mouth dryness continues, then call our office  Follow up in 1 year     Chesley Mires, MD [place] [facility] Pager:  [phone] 02/27/2016, 2:28 PM

## 2016-03-16 ENCOUNTER — Other Ambulatory Visit: Payer: Self-pay | Admitting: Internal Medicine

## 2016-03-30 ENCOUNTER — Other Ambulatory Visit: Payer: Self-pay | Admitting: Internal Medicine

## 2016-04-10 ENCOUNTER — Other Ambulatory Visit: Payer: Self-pay | Admitting: Internal Medicine

## 2016-05-01 DIAGNOSIS — Z961 Presence of intraocular lens: Secondary | ICD-10-CM | POA: Diagnosis not present

## 2016-05-01 DIAGNOSIS — H35341 Macular cyst, hole, or pseudohole, right eye: Secondary | ICD-10-CM | POA: Diagnosis not present

## 2016-05-01 DIAGNOSIS — H353131 Nonexudative age-related macular degeneration, bilateral, early dry stage: Secondary | ICD-10-CM | POA: Diagnosis not present

## 2016-05-01 DIAGNOSIS — H43822 Vitreomacular adhesion, left eye: Secondary | ICD-10-CM | POA: Diagnosis not present

## 2016-06-05 DIAGNOSIS — G4733 Obstructive sleep apnea (adult) (pediatric): Secondary | ICD-10-CM | POA: Diagnosis not present

## 2016-06-21 ENCOUNTER — Other Ambulatory Visit: Payer: Self-pay | Admitting: Internal Medicine

## 2016-06-24 NOTE — Telephone Encounter (Signed)
Done erx 

## 2016-07-12 ENCOUNTER — Other Ambulatory Visit: Payer: Self-pay | Admitting: Internal Medicine

## 2016-07-25 ENCOUNTER — Ambulatory Visit (INDEPENDENT_AMBULATORY_CARE_PROVIDER_SITE_OTHER): Payer: Medicare Other | Admitting: Internal Medicine

## 2016-07-25 ENCOUNTER — Telehealth: Payer: Self-pay

## 2016-07-25 ENCOUNTER — Other Ambulatory Visit (INDEPENDENT_AMBULATORY_CARE_PROVIDER_SITE_OTHER): Payer: Medicare Other

## 2016-07-25 DIAGNOSIS — E7800 Pure hypercholesterolemia, unspecified: Secondary | ICD-10-CM | POA: Diagnosis not present

## 2016-07-25 DIAGNOSIS — Z23 Encounter for immunization: Secondary | ICD-10-CM | POA: Diagnosis not present

## 2016-07-25 DIAGNOSIS — R7302 Impaired glucose tolerance (oral): Secondary | ICD-10-CM | POA: Diagnosis not present

## 2016-07-25 DIAGNOSIS — F0390 Unspecified dementia without behavioral disturbance: Secondary | ICD-10-CM

## 2016-07-25 DIAGNOSIS — I10 Essential (primary) hypertension: Secondary | ICD-10-CM | POA: Diagnosis not present

## 2016-07-25 DIAGNOSIS — Z0001 Encounter for general adult medical examination with abnormal findings: Secondary | ICD-10-CM | POA: Diagnosis not present

## 2016-07-25 LAB — TSH: TSH: 1.44 u[IU]/mL (ref 0.35–4.50)

## 2016-07-25 LAB — HEMOGLOBIN A1C: HEMOGLOBIN A1C: 6.1 % (ref 4.6–6.5)

## 2016-07-25 LAB — URINALYSIS, ROUTINE W REFLEX MICROSCOPIC
Bilirubin Urine: NEGATIVE
Hgb urine dipstick: NEGATIVE
Ketones, ur: NEGATIVE
LEUKOCYTES UA: NEGATIVE
Nitrite: NEGATIVE
PH: 6 (ref 5.0–8.0)
SPECIFIC GRAVITY, URINE: 1.02 (ref 1.000–1.030)
Total Protein, Urine: NEGATIVE
Urine Glucose: NEGATIVE
Urobilinogen, UA: 0.2 (ref 0.0–1.0)
WBC, UA: NONE SEEN (ref 0–?)

## 2016-07-25 LAB — HEPATIC FUNCTION PANEL
ALK PHOS: 58 U/L (ref 39–117)
ALT: 18 U/L (ref 0–35)
AST: 24 U/L (ref 0–37)
Albumin: 4.2 g/dL (ref 3.5–5.2)
BILIRUBIN DIRECT: 0.1 mg/dL (ref 0.0–0.3)
Total Bilirubin: 0.5 mg/dL (ref 0.2–1.2)
Total Protein: 7.4 g/dL (ref 6.0–8.3)

## 2016-07-25 LAB — CBC WITH DIFFERENTIAL/PLATELET
Basophils Absolute: 0 10*3/uL (ref 0.0–0.1)
Basophils Relative: 0.3 % (ref 0.0–3.0)
EOS PCT: 2.1 % (ref 0.0–5.0)
Eosinophils Absolute: 0.1 10*3/uL (ref 0.0–0.7)
HCT: 37.7 % (ref 36.0–46.0)
Hemoglobin: 12.8 g/dL (ref 12.0–15.0)
LYMPHS ABS: 1.3 10*3/uL (ref 0.7–4.0)
Lymphocytes Relative: 20.6 % (ref 12.0–46.0)
MCHC: 33.9 g/dL (ref 30.0–36.0)
MCV: 95.2 fl (ref 78.0–100.0)
MONO ABS: 0.5 10*3/uL (ref 0.1–1.0)
MONOS PCT: 8.8 % (ref 3.0–12.0)
NEUTROS ABS: 4.2 10*3/uL (ref 1.4–7.7)
NEUTROS PCT: 68.2 % (ref 43.0–77.0)
PLATELETS: 315 10*3/uL (ref 150.0–400.0)
RBC: 3.96 Mil/uL (ref 3.87–5.11)
RDW: 14.8 % (ref 11.5–15.5)
WBC: 6.2 10*3/uL (ref 4.0–10.5)

## 2016-07-25 LAB — BASIC METABOLIC PANEL
BUN: 19 mg/dL (ref 6–23)
CO2: 32 meq/L (ref 19–32)
Calcium: 10.2 mg/dL (ref 8.4–10.5)
Chloride: 103 mEq/L (ref 96–112)
Creatinine, Ser: 0.77 mg/dL (ref 0.40–1.20)
GFR: 75.36 mL/min (ref >60.00)
GLUCOSE: 105 mg/dL — AB (ref 70–99)
POTASSIUM: 4.3 meq/L (ref 3.5–5.1)
SODIUM: 141 meq/L (ref 135–145)

## 2016-07-25 LAB — LIPID PANEL
CHOLESTEROL: 138 mg/dL (ref 0–200)
HDL: 65.5 mg/dL (ref >39.00)
LDL Cholesterol: 61 mg/dL (ref 0–99)
NONHDL: 72.96
Total CHOL/HDL Ratio: 2
Triglycerides: 61 mg/dL (ref 0.0–149.0)
VLDL: 12.2 mg/dL (ref 0.0–40.0)

## 2016-07-25 MED ORDER — CARVEDILOL 3.125 MG PO TABS: 3.1250 mg | ORAL_TABLET | Freq: Every day | ORAL | 0 refills | Status: DC

## 2016-07-25 MED ORDER — MEMANTINE HCL 10 MG PO TABS: 10.0000 mg | ORAL_TABLET | Freq: Two times a day (BID) | ORAL | 0 refills | Status: DC

## 2016-07-25 MED ORDER — SERTRALINE HCL 100 MG PO TABS: 150.0000 mg | ORAL_TABLET | Freq: Every day | ORAL | 0 refills | Status: DC

## 2016-07-25 NOTE — Progress Notes (Signed)
Subjective:    Patient ID: Anne Snyder, female    DOB: [birth year], 82 y.o.   MRN: [id number]  HPI  Here to f/u by herself today; overall doing ok,  Pt denies chest pain, increasing sob or doe, wheezing, orthopnea, PND, increased LE swelling, palpitations, dizziness or syncope.  Pt denies new neurological symptoms such as new headache, or facial or extremity weakness or numbness.  Pt denies polydipsia, polyuria, or low sugar episode.   Pt denies new neurological symptoms such as new headache, or facial or extremity weakness or numbness.   Pt states overall good compliance with meds, mostly trying to follow appropriate diet, with wt overall stable,  but little exercise however. Fallen twice in past 2 mo, has cane, declines walker or PT.  Due for flu shot.  No other new history change Past Medical History:  Diagnosis Date  . Arthritis   . Cancer [facility])    Breast malignancy  . Cervical disc disease   . CHF (congestive heart failure) ([place])   . Colon polyps   . Diverticulitis   . GERD (gastroesophageal reflux disease)   . Heart disease   . Hyperlipidemia   . Hypertension   . Impaired glucose tolerance 03/14/2011  . Kidney stones   . Myocardial infarct 03/14/2011  . Sleep apnea   . Urine incontinence   . UTI (lower urinary tract infection)    Past Surgical History:  Procedure Laterality Date  . ABDOMINAL HYSTERECTOMY  2001  . BREAST BIOPSY  2002  . By pass surgury  05/2006  . CATARACT EXTRACTION  2005  . CORONARY ARTERY BYPASS GRAFT    . Heart attack  12/2005 and 01/2006  . masectomy  09/1999    reports that she has never smoked. She does not have any smokeless tobacco history on file. She reports that she does not drink alcohol or use drugs. family history includes Arthritis in her father, mother, and other; Cancer in her mother and other; Diabetes in her father and mother; Stroke in her father and mother. Allergies  Allergen Reactions  . Sulfa Antibiotics    Current Outpatient  Prescriptions on File Prior to Visit  Medication Sig Dispense Refill  . aspirin 325 MG tablet Take 325 mg by mouth daily.      Marland Kitchen atorvastatin (LIPITOR) 40 MG tablet Take 1 tablet (40 mg total) by mouth daily. 90 tablet 2  . Cholecalciferol (VITAMIN D) 2000 UNITS CAPS Take by mouth.    . donepezil (ARICEPT) 10 MG tablet Take 1 tablet by mouth  every night at bedtime 90 tablet 2  . NON FORMULARY Tumeric    . omeprazole (PRILOSEC) 40 MG capsule Take 1 capsule by mouth  daily 90 capsule 3  . oxybutynin (DITROPAN) 5 MG tablet Take 1 tablet by mouth  twice a day 180 tablet 1  . rOPINIRole (REQUIP) 1 MG tablet TAKE 1 TABLET BY MOUTH  EVERY NIGHT AT BEDTIME 90 tablet 3  . vitamin B-12 (CYANOCOBALAMIN) 1000 MCG tablet Take 1,000 mcg by mouth daily.     No current facility-administered medications on file prior to visit.     Review of Systems  Constitutional: Negative for unusual diaphoresis or night sweats HENT: Negative for ear swelling or discharge Eyes: Negative for worsening visual haziness  Respiratory: Negative for choking and stridor.   Gastrointestinal: Negative for distension or worsening eructation Genitourinary: Negative for retention or change in urine volume.  Musculoskeletal: Negative for other MSK pain or swelling Skin:  Negative for color change and worsening wound Neurological: Negative for tremors and numbness other than noted  Psychiatric/Behavioral: Negative for decreased concentration or agitation other than above   All other system neg per pt    Objective:   Physical Exam BP 118/72   Pulse 89   Temp 98.4 F (36.9 C) (Oral)   Resp 20   Wt 89 lb (40.4 kg)   SpO2 92%   BMI 17.98 kg/m  VS noted, thin, frail Constitutional: Pt appears in no apparent distress HENT: Head: NCAT.  Right Ear: External ear normal.  Left Ear: External ear normal.  Eyes: . Pupils are equal, round, and reactive to light. Conjunctivae and EOM are normal Neck: Normal range of motion. Neck  supple.  Cardiovascular: Normal rate and regular rhythm.   Pulmonary/Chest: Effort normal and breath sounds without rales or wheezing.  Abd:  Soft, NT, ND, + BS Neurological: Pt is alert. At baseline confused , motor grossly intact Skin: Skin is warm. No rash, no LE edema Psychiatric: Pt behavior is normal. No agitation.  No other new exam findings Lab Results  Component Value Date   WBC 6.2 07/25/2016   HGB 12.8 07/25/2016   HCT 37.7 07/25/2016   PLT 315.0 07/25/2016   GLUCOSE 105 (H) 07/25/2016   CHOL 138 07/25/2016   TRIG 61.0 07/25/2016   HDL 65.50 07/25/2016   LDLCALC 61 07/25/2016   ALT 18 07/25/2016   AST 24 07/25/2016   NA 141 07/25/2016   K 4.3 07/25/2016   CL 103 07/25/2016   CREATININE 0.77 07/25/2016   BUN 19 07/25/2016   CO2 32 07/25/2016   TSH 1.44 07/25/2016   HGBA1C 6.1 07/25/2016       Assessment & Plan:

## 2016-07-25 NOTE — Progress Notes (Signed)
Pre visit review using our clinic review tool, if applicable. No additional management support is needed unless otherwise documented below in the visit note. 

## 2016-07-25 NOTE — Telephone Encounter (Signed)
Medication refill sent to pharmacy  

## 2016-07-25 NOTE — Patient Instructions (Addendum)

## 2016-07-26 NOTE — Assessment & Plan Note (Signed)
stable overall by history and exam, recent data reviewed with pt, and pt to continue medical treatment as before,  to f/u any worsening symptoms or concerns Lab Results  Component Value Date   LDLCALC 61 07/25/2016

## 2016-07-26 NOTE — Assessment & Plan Note (Signed)
stable overall by history and exam, recent data reviewed with pt, and pt to continue medical treatment as before,  to f/u any worsening symptoms or concerns BP Readings from Last 3 Encounters:  07/25/16 118/72  02/27/16 118/62  01/30/16 100/62

## 2016-07-26 NOTE — Assessment & Plan Note (Signed)
stable overall by history and exam, no reported behavioral change, recent data reviewed with pt, and pt to continue medical treatment as before,  to f/u any worsening symptoms or concerns

## 2016-07-26 NOTE — Assessment & Plan Note (Signed)
stable overall by history and exam, recent data reviewed with pt, and pt to continue medical treatment as before,  to f/u any worsening symptoms or concerns Lab Results  Component Value Date   HGBA1C 6.1 07/25/2016

## 2016-09-01 ENCOUNTER — Telehealth: Payer: Self-pay

## 2016-09-01 NOTE — Telephone Encounter (Signed)
Left message advising patient that summary of benefits has been recd for prolia injection----estimated $260 copay---patient can schedule nurse visit anytime at her convenience --can talk with Yanelle Sousa if any questions

## 2016-09-02 NOTE — Telephone Encounter (Signed)
error 

## 2016-09-09 DIAGNOSIS — Z8781 Personal history of (healed) traumatic fracture: Secondary | ICD-10-CM | POA: Diagnosis not present

## 2016-09-09 DIAGNOSIS — S12490A Other displaced fracture of fifth cervical vertebra, initial encounter for closed fracture: Secondary | ICD-10-CM | POA: Diagnosis not present

## 2016-09-09 DIAGNOSIS — S12190A Other displaced fracture of second cervical vertebra, initial encounter for closed fracture: Secondary | ICD-10-CM | POA: Diagnosis not present

## 2016-09-09 DIAGNOSIS — S12200A Unspecified displaced fracture of third cervical vertebra, initial encounter for closed fracture: Secondary | ICD-10-CM | POA: Diagnosis not present

## 2016-09-09 DIAGNOSIS — S299XXA Unspecified injury of thorax, initial encounter: Secondary | ICD-10-CM | POA: Diagnosis not present

## 2016-09-09 DIAGNOSIS — M549 Dorsalgia, unspecified: Secondary | ICD-10-CM | POA: Diagnosis not present

## 2016-09-09 DIAGNOSIS — S12290A Other displaced fracture of third cervical vertebra, initial encounter for closed fracture: Secondary | ICD-10-CM | POA: Diagnosis not present

## 2016-09-09 DIAGNOSIS — K661 Hemoperitoneum: Secondary | ICD-10-CM | POA: Diagnosis not present

## 2016-09-09 DIAGNOSIS — R202 Paresthesia of skin: Secondary | ICD-10-CM | POA: Diagnosis not present

## 2016-09-09 DIAGNOSIS — S12590A Other displaced fracture of sixth cervical vertebra, initial encounter for closed fracture: Secondary | ICD-10-CM | POA: Diagnosis not present

## 2016-09-09 DIAGNOSIS — S12390A Other displaced fracture of fourth cervical vertebra, initial encounter for closed fracture: Secondary | ICD-10-CM | POA: Diagnosis not present

## 2016-09-09 DIAGNOSIS — S14105A Unspecified injury at C5 level of cervical spinal cord, initial encounter: Secondary | ICD-10-CM | POA: Diagnosis not present

## 2016-09-09 DIAGNOSIS — R0789 Other chest pain: Secondary | ICD-10-CM | POA: Diagnosis not present

## 2016-09-09 DIAGNOSIS — M542 Cervicalgia: Secondary | ICD-10-CM | POA: Diagnosis not present

## 2016-09-09 DIAGNOSIS — S12100A Unspecified displaced fracture of second cervical vertebra, initial encounter for closed fracture: Secondary | ICD-10-CM | POA: Diagnosis not present

## 2016-09-09 DIAGNOSIS — S2242XA Multiple fractures of ribs, left side, initial encounter for closed fracture: Secondary | ICD-10-CM | POA: Diagnosis not present

## 2016-09-09 DIAGNOSIS — S064X0A Epidural hemorrhage without loss of consciousness, initial encounter: Secondary | ICD-10-CM | POA: Diagnosis not present

## 2016-09-09 DIAGNOSIS — S0990XA Unspecified injury of head, initial encounter: Secondary | ICD-10-CM | POA: Diagnosis not present

## 2016-09-09 DIAGNOSIS — S12400A Unspecified displaced fracture of fifth cervical vertebra, initial encounter for closed fracture: Secondary | ICD-10-CM | POA: Diagnosis not present

## 2016-09-09 DIAGNOSIS — S3991XA Unspecified injury of abdomen, initial encounter: Secondary | ICD-10-CM | POA: Diagnosis not present

## 2016-09-09 DIAGNOSIS — T1490XA Injury, unspecified, initial encounter: Secondary | ICD-10-CM | POA: Diagnosis not present

## 2016-09-09 DIAGNOSIS — S12301A Unspecified nondisplaced fracture of fourth cervical vertebra, initial encounter for closed fracture: Secondary | ICD-10-CM | POA: Diagnosis not present

## 2016-09-10 DIAGNOSIS — S22021A Stable burst fracture of second thoracic vertebra, initial encounter for closed fracture: Secondary | ICD-10-CM | POA: Diagnosis not present

## 2016-09-10 DIAGNOSIS — I10 Essential (primary) hypertension: Secondary | ICD-10-CM | POA: Diagnosis not present

## 2016-09-10 DIAGNOSIS — S12190D Other displaced fracture of second cervical vertebra, subsequent encounter for fracture with routine healing: Secondary | ICD-10-CM | POA: Diagnosis not present

## 2016-09-10 DIAGNOSIS — T1490XA Injury, unspecified, initial encounter: Secondary | ICD-10-CM | POA: Diagnosis not present

## 2016-09-10 DIAGNOSIS — S12530D Unspecified traumatic displaced spondylolisthesis of sixth cervical vertebra, subsequent encounter for fracture with routine healing: Secondary | ICD-10-CM | POA: Diagnosis not present

## 2016-09-10 DIAGNOSIS — S12390D Other displaced fracture of fourth cervical vertebra, subsequent encounter for fracture with routine healing: Secondary | ICD-10-CM | POA: Diagnosis not present

## 2016-09-10 DIAGNOSIS — S12490D Other displaced fracture of fifth cervical vertebra, subsequent encounter for fracture with routine healing: Secondary | ICD-10-CM | POA: Diagnosis not present

## 2016-09-10 DIAGNOSIS — Z4689 Encounter for fitting and adjustment of other specified devices: Secondary | ICD-10-CM | POA: Diagnosis not present

## 2016-09-10 DIAGNOSIS — S12290D Other displaced fracture of third cervical vertebra, subsequent encounter for fracture with routine healing: Secondary | ICD-10-CM | POA: Diagnosis not present

## 2016-09-10 DIAGNOSIS — M5030 Other cervical disc degeneration, unspecified cervical region: Secondary | ICD-10-CM | POA: Diagnosis not present

## 2016-09-10 DIAGNOSIS — S129XXA Fracture of neck, unspecified, initial encounter: Secondary | ICD-10-CM | POA: Diagnosis not present

## 2016-09-10 DIAGNOSIS — I6521 Occlusion and stenosis of right carotid artery: Secondary | ICD-10-CM | POA: Diagnosis not present

## 2016-09-10 DIAGNOSIS — S12400A Unspecified displaced fracture of fifth cervical vertebra, initial encounter for closed fracture: Secondary | ICD-10-CM | POA: Diagnosis not present

## 2016-09-10 DIAGNOSIS — Z9012 Acquired absence of left breast and nipple: Secondary | ICD-10-CM | POA: Diagnosis not present

## 2016-09-10 DIAGNOSIS — M1990 Unspecified osteoarthritis, unspecified site: Secondary | ICD-10-CM | POA: Diagnosis not present

## 2016-09-10 DIAGNOSIS — M5000 Cervical disc disorder with myelopathy, unspecified cervical region: Secondary | ICD-10-CM | POA: Diagnosis not present

## 2016-09-10 DIAGNOSIS — Z951 Presence of aortocoronary bypass graft: Secondary | ICD-10-CM | POA: Diagnosis not present

## 2016-09-10 DIAGNOSIS — I6502 Occlusion and stenosis of left vertebral artery: Secondary | ICD-10-CM | POA: Diagnosis not present

## 2016-09-10 DIAGNOSIS — I6622 Occlusion and stenosis of left posterior cerebral artery: Secondary | ICD-10-CM | POA: Diagnosis not present

## 2016-09-10 DIAGNOSIS — G959 Disease of spinal cord, unspecified: Secondary | ICD-10-CM | POA: Diagnosis not present

## 2016-09-10 DIAGNOSIS — M4802 Spinal stenosis, cervical region: Secondary | ICD-10-CM | POA: Diagnosis not present

## 2016-09-10 DIAGNOSIS — G2581 Restless legs syndrome: Secondary | ICD-10-CM | POA: Diagnosis not present

## 2016-09-10 DIAGNOSIS — G8911 Acute pain due to trauma: Secondary | ICD-10-CM | POA: Diagnosis not present

## 2016-09-10 DIAGNOSIS — Z7409 Other reduced mobility: Secondary | ICD-10-CM | POA: Diagnosis not present

## 2016-09-10 DIAGNOSIS — R413 Other amnesia: Secondary | ICD-10-CM | POA: Diagnosis not present

## 2016-09-10 DIAGNOSIS — I7774 Dissection of vertebral artery: Secondary | ICD-10-CM | POA: Diagnosis not present

## 2016-09-10 DIAGNOSIS — S22081A Stable burst fracture of T11-T12 vertebra, initial encounter for closed fracture: Secondary | ICD-10-CM | POA: Diagnosis not present

## 2016-09-10 DIAGNOSIS — M47812 Spondylosis without myelopathy or radiculopathy, cervical region: Secondary | ICD-10-CM | POA: Diagnosis not present

## 2016-09-10 DIAGNOSIS — S12100A Unspecified displaced fracture of second cervical vertebra, initial encounter for closed fracture: Secondary | ICD-10-CM | POA: Diagnosis not present

## 2016-09-10 DIAGNOSIS — S14106A Unspecified injury at C6 level of cervical spinal cord, initial encounter: Secondary | ICD-10-CM | POA: Diagnosis not present

## 2016-09-10 DIAGNOSIS — S064X9A Epidural hemorrhage with loss of consciousness of unspecified duration, initial encounter: Secondary | ICD-10-CM | POA: Diagnosis not present

## 2016-09-10 DIAGNOSIS — S12500A Unspecified displaced fracture of sixth cervical vertebra, initial encounter for closed fracture: Secondary | ICD-10-CM | POA: Diagnosis not present

## 2016-09-10 DIAGNOSIS — Z79899 Other long term (current) drug therapy: Secondary | ICD-10-CM | POA: Diagnosis not present

## 2016-09-10 DIAGNOSIS — R0689 Other abnormalities of breathing: Secondary | ICD-10-CM | POA: Diagnosis not present

## 2016-09-10 DIAGNOSIS — S12300A Unspecified displaced fracture of fourth cervical vertebra, initial encounter for closed fracture: Secondary | ICD-10-CM | POA: Diagnosis not present

## 2016-09-10 DIAGNOSIS — G4700 Insomnia, unspecified: Secondary | ICD-10-CM | POA: Diagnosis not present

## 2016-09-10 DIAGNOSIS — R296 Repeated falls: Secondary | ICD-10-CM | POA: Diagnosis not present

## 2016-09-10 DIAGNOSIS — S2242XA Multiple fractures of ribs, left side, initial encounter for closed fracture: Secondary | ICD-10-CM | POA: Diagnosis not present

## 2016-09-10 DIAGNOSIS — R402413 Glasgow coma scale score 13-15, at hospital admission: Secondary | ICD-10-CM | POA: Diagnosis not present

## 2016-09-10 DIAGNOSIS — S12200A Unspecified displaced fracture of third cervical vertebra, initial encounter for closed fracture: Secondary | ICD-10-CM | POA: Diagnosis not present

## 2016-09-10 DIAGNOSIS — E785 Hyperlipidemia, unspecified: Secondary | ICD-10-CM | POA: Diagnosis not present

## 2016-09-10 DIAGNOSIS — I2510 Atherosclerotic heart disease of native coronary artery without angina pectoris: Secondary | ICD-10-CM | POA: Diagnosis not present

## 2016-09-10 DIAGNOSIS — E538 Deficiency of other specified B group vitamins: Secondary | ICD-10-CM | POA: Diagnosis not present

## 2016-09-10 DIAGNOSIS — S2239XA Fracture of one rib, unspecified side, initial encounter for closed fracture: Secondary | ICD-10-CM | POA: Diagnosis not present

## 2016-09-12 ENCOUNTER — Other Ambulatory Visit: Payer: Self-pay | Admitting: Internal Medicine

## 2016-09-15 ENCOUNTER — Telehealth: Payer: Self-pay | Admitting: Internal Medicine

## 2016-09-15 DIAGNOSIS — S129XXA Fracture of neck, unspecified, initial encounter: Secondary | ICD-10-CM | POA: Diagnosis not present

## 2016-09-15 DIAGNOSIS — Z951 Presence of aortocoronary bypass graft: Secondary | ICD-10-CM | POA: Diagnosis not present

## 2016-09-15 DIAGNOSIS — S12290D Other displaced fracture of third cervical vertebra, subsequent encounter for fracture with routine healing: Secondary | ICD-10-CM | POA: Diagnosis not present

## 2016-09-15 DIAGNOSIS — I2510 Atherosclerotic heart disease of native coronary artery without angina pectoris: Secondary | ICD-10-CM | POA: Diagnosis not present

## 2016-09-15 DIAGNOSIS — G2581 Restless legs syndrome: Secondary | ICD-10-CM | POA: Diagnosis not present

## 2016-09-15 DIAGNOSIS — Z79899 Other long term (current) drug therapy: Secondary | ICD-10-CM | POA: Diagnosis not present

## 2016-09-15 DIAGNOSIS — E785 Hyperlipidemia, unspecified: Secondary | ICD-10-CM | POA: Diagnosis not present

## 2016-09-15 DIAGNOSIS — S12390D Other displaced fracture of fourth cervical vertebra, subsequent encounter for fracture with routine healing: Secondary | ICD-10-CM | POA: Diagnosis not present

## 2016-09-15 DIAGNOSIS — E538 Deficiency of other specified B group vitamins: Secondary | ICD-10-CM | POA: Diagnosis not present

## 2016-09-15 DIAGNOSIS — G4700 Insomnia, unspecified: Secondary | ICD-10-CM | POA: Diagnosis not present

## 2016-09-15 DIAGNOSIS — Z7409 Other reduced mobility: Secondary | ICD-10-CM | POA: Diagnosis not present

## 2016-09-15 DIAGNOSIS — I10 Essential (primary) hypertension: Secondary | ICD-10-CM | POA: Diagnosis not present

## 2016-09-15 DIAGNOSIS — I6622 Occlusion and stenosis of left posterior cerebral artery: Secondary | ICD-10-CM | POA: Diagnosis not present

## 2016-09-15 DIAGNOSIS — R5381 Other malaise: Secondary | ICD-10-CM | POA: Diagnosis not present

## 2016-09-15 DIAGNOSIS — I6502 Occlusion and stenosis of left vertebral artery: Secondary | ICD-10-CM | POA: Diagnosis not present

## 2016-09-15 DIAGNOSIS — G8911 Acute pain due to trauma: Secondary | ICD-10-CM | POA: Diagnosis not present

## 2016-09-15 DIAGNOSIS — I6521 Occlusion and stenosis of right carotid artery: Secondary | ICD-10-CM | POA: Diagnosis not present

## 2016-09-15 DIAGNOSIS — M542 Cervicalgia: Secondary | ICD-10-CM | POA: Diagnosis not present

## 2016-09-15 DIAGNOSIS — S12190D Other displaced fracture of second cervical vertebra, subsequent encounter for fracture with routine healing: Secondary | ICD-10-CM | POA: Diagnosis not present

## 2016-09-15 DIAGNOSIS — S12490D Other displaced fracture of fifth cervical vertebra, subsequent encounter for fracture with routine healing: Secondary | ICD-10-CM | POA: Diagnosis not present

## 2016-09-15 DIAGNOSIS — S12530D Unspecified traumatic displaced spondylolisthesis of sixth cervical vertebra, subsequent encounter for fracture with routine healing: Secondary | ICD-10-CM | POA: Diagnosis not present

## 2016-09-15 DIAGNOSIS — N3281 Overactive bladder: Secondary | ICD-10-CM | POA: Diagnosis not present

## 2016-09-15 DIAGNOSIS — R296 Repeated falls: Secondary | ICD-10-CM | POA: Diagnosis not present

## 2016-09-15 DIAGNOSIS — Z4689 Encounter for fitting and adjustment of other specified devices: Secondary | ICD-10-CM | POA: Diagnosis not present

## 2016-09-15 NOTE — Telephone Encounter (Signed)
Called patient regarding awv. Left msg for patient to call office to schedule appt.  °

## 2016-09-16 DIAGNOSIS — I2510 Atherosclerotic heart disease of native coronary artery without angina pectoris: Secondary | ICD-10-CM | POA: Diagnosis not present

## 2016-09-16 DIAGNOSIS — E785 Hyperlipidemia, unspecified: Secondary | ICD-10-CM | POA: Diagnosis not present

## 2016-09-16 DIAGNOSIS — R5381 Other malaise: Secondary | ICD-10-CM | POA: Diagnosis not present

## 2016-09-19 DIAGNOSIS — I2510 Atherosclerotic heart disease of native coronary artery without angina pectoris: Secondary | ICD-10-CM | POA: Diagnosis not present

## 2016-09-19 DIAGNOSIS — S129XXA Fracture of neck, unspecified, initial encounter: Secondary | ICD-10-CM | POA: Diagnosis not present

## 2016-09-23 DIAGNOSIS — R5381 Other malaise: Secondary | ICD-10-CM | POA: Diagnosis not present

## 2016-09-23 DIAGNOSIS — N3281 Overactive bladder: Secondary | ICD-10-CM | POA: Diagnosis not present

## 2016-09-25 ENCOUNTER — Other Ambulatory Visit: Payer: Self-pay | Admitting: Internal Medicine

## 2016-10-02 DIAGNOSIS — M542 Cervicalgia: Secondary | ICD-10-CM | POA: Diagnosis not present

## 2016-10-02 DIAGNOSIS — S129XXA Fracture of neck, unspecified, initial encounter: Secondary | ICD-10-CM | POA: Diagnosis not present

## 2016-10-03 DIAGNOSIS — I2510 Atherosclerotic heart disease of native coronary artery without angina pectoris: Secondary | ICD-10-CM | POA: Diagnosis not present

## 2016-10-03 DIAGNOSIS — R5381 Other malaise: Secondary | ICD-10-CM | POA: Diagnosis not present

## 2016-10-03 DIAGNOSIS — G2581 Restless legs syndrome: Secondary | ICD-10-CM | POA: Diagnosis not present

## 2016-10-06 DIAGNOSIS — Z9181 History of falling: Secondary | ICD-10-CM | POA: Diagnosis not present

## 2016-10-06 DIAGNOSIS — I2510 Atherosclerotic heart disease of native coronary artery without angina pectoris: Secondary | ICD-10-CM | POA: Diagnosis not present

## 2016-10-06 DIAGNOSIS — R269 Unspecified abnormalities of gait and mobility: Secondary | ICD-10-CM | POA: Diagnosis not present

## 2016-10-06 DIAGNOSIS — S12190D Other displaced fracture of second cervical vertebra, subsequent encounter for fracture with routine healing: Secondary | ICD-10-CM | POA: Diagnosis not present

## 2016-10-06 DIAGNOSIS — G2581 Restless legs syndrome: Secondary | ICD-10-CM | POA: Diagnosis not present

## 2016-10-06 DIAGNOSIS — S12290D Other displaced fracture of third cervical vertebra, subsequent encounter for fracture with routine healing: Secondary | ICD-10-CM | POA: Diagnosis not present

## 2016-10-06 DIAGNOSIS — M6281 Muscle weakness (generalized): Secondary | ICD-10-CM | POA: Diagnosis not present

## 2016-10-06 DIAGNOSIS — S12530D Unspecified traumatic displaced spondylolisthesis of sixth cervical vertebra, subsequent encounter for fracture with routine healing: Secondary | ICD-10-CM | POA: Diagnosis not present

## 2016-10-06 DIAGNOSIS — S12390D Other displaced fracture of fourth cervical vertebra, subsequent encounter for fracture with routine healing: Secondary | ICD-10-CM | POA: Diagnosis not present

## 2016-10-06 DIAGNOSIS — S12490D Other displaced fracture of fifth cervical vertebra, subsequent encounter for fracture with routine healing: Secondary | ICD-10-CM | POA: Diagnosis not present

## 2016-10-08 DIAGNOSIS — S12290D Other displaced fracture of third cervical vertebra, subsequent encounter for fracture with routine healing: Secondary | ICD-10-CM | POA: Diagnosis not present

## 2016-10-08 DIAGNOSIS — I2510 Atherosclerotic heart disease of native coronary artery without angina pectoris: Secondary | ICD-10-CM | POA: Diagnosis not present

## 2016-10-08 DIAGNOSIS — S12390D Other displaced fracture of fourth cervical vertebra, subsequent encounter for fracture with routine healing: Secondary | ICD-10-CM | POA: Diagnosis not present

## 2016-10-08 DIAGNOSIS — S12490D Other displaced fracture of fifth cervical vertebra, subsequent encounter for fracture with routine healing: Secondary | ICD-10-CM | POA: Diagnosis not present

## 2016-10-08 DIAGNOSIS — S12530D Unspecified traumatic displaced spondylolisthesis of sixth cervical vertebra, subsequent encounter for fracture with routine healing: Secondary | ICD-10-CM | POA: Diagnosis not present

## 2016-10-08 DIAGNOSIS — G2581 Restless legs syndrome: Secondary | ICD-10-CM | POA: Diagnosis not present

## 2016-10-08 DIAGNOSIS — R269 Unspecified abnormalities of gait and mobility: Secondary | ICD-10-CM | POA: Diagnosis not present

## 2016-10-08 DIAGNOSIS — S12190D Other displaced fracture of second cervical vertebra, subsequent encounter for fracture with routine healing: Secondary | ICD-10-CM | POA: Diagnosis not present

## 2016-10-08 DIAGNOSIS — Z9181 History of falling: Secondary | ICD-10-CM | POA: Diagnosis not present

## 2016-10-08 DIAGNOSIS — M6281 Muscle weakness (generalized): Secondary | ICD-10-CM | POA: Diagnosis not present

## 2016-10-09 DIAGNOSIS — W109XXD Fall (on) (from) unspecified stairs and steps, subsequent encounter: Secondary | ICD-10-CM | POA: Diagnosis not present

## 2016-10-09 DIAGNOSIS — S12190D Other displaced fracture of second cervical vertebra, subsequent encounter for fracture with routine healing: Secondary | ICD-10-CM | POA: Diagnosis not present

## 2016-10-09 DIAGNOSIS — I2510 Atherosclerotic heart disease of native coronary artery without angina pectoris: Secondary | ICD-10-CM | POA: Diagnosis not present

## 2016-10-09 DIAGNOSIS — I10 Essential (primary) hypertension: Secondary | ICD-10-CM | POA: Diagnosis not present

## 2016-10-09 DIAGNOSIS — S12290D Other displaced fracture of third cervical vertebra, subsequent encounter for fracture with routine healing: Secondary | ICD-10-CM | POA: Diagnosis not present

## 2016-10-09 DIAGNOSIS — S12490D Other displaced fracture of fifth cervical vertebra, subsequent encounter for fracture with routine healing: Secondary | ICD-10-CM | POA: Diagnosis not present

## 2016-10-09 DIAGNOSIS — S12530D Unspecified traumatic displaced spondylolisthesis of sixth cervical vertebra, subsequent encounter for fracture with routine healing: Secondary | ICD-10-CM | POA: Diagnosis not present

## 2016-10-09 DIAGNOSIS — I7774 Dissection of vertebral artery: Secondary | ICD-10-CM | POA: Diagnosis not present

## 2016-10-09 DIAGNOSIS — S129XXA Fracture of neck, unspecified, initial encounter: Secondary | ICD-10-CM | POA: Diagnosis not present

## 2016-10-09 DIAGNOSIS — G2581 Restless legs syndrome: Secondary | ICD-10-CM | POA: Diagnosis not present

## 2016-10-09 DIAGNOSIS — Z9181 History of falling: Secondary | ICD-10-CM | POA: Diagnosis not present

## 2016-10-09 DIAGNOSIS — S12390D Other displaced fracture of fourth cervical vertebra, subsequent encounter for fracture with routine healing: Secondary | ICD-10-CM | POA: Diagnosis not present

## 2016-10-14 ENCOUNTER — Telehealth: Payer: Self-pay

## 2016-10-14 DIAGNOSIS — S12390D Other displaced fracture of fourth cervical vertebra, subsequent encounter for fracture with routine healing: Secondary | ICD-10-CM | POA: Diagnosis not present

## 2016-10-14 DIAGNOSIS — S12190D Other displaced fracture of second cervical vertebra, subsequent encounter for fracture with routine healing: Secondary | ICD-10-CM | POA: Diagnosis not present

## 2016-10-14 DIAGNOSIS — S12490D Other displaced fracture of fifth cervical vertebra, subsequent encounter for fracture with routine healing: Secondary | ICD-10-CM | POA: Diagnosis not present

## 2016-10-14 DIAGNOSIS — S12530D Unspecified traumatic displaced spondylolisthesis of sixth cervical vertebra, subsequent encounter for fracture with routine healing: Secondary | ICD-10-CM | POA: Diagnosis not present

## 2016-10-14 DIAGNOSIS — Z9181 History of falling: Secondary | ICD-10-CM | POA: Diagnosis not present

## 2016-10-14 DIAGNOSIS — G2581 Restless legs syndrome: Secondary | ICD-10-CM | POA: Diagnosis not present

## 2016-10-14 DIAGNOSIS — I2510 Atherosclerotic heart disease of native coronary artery without angina pectoris: Secondary | ICD-10-CM | POA: Diagnosis not present

## 2016-10-14 DIAGNOSIS — S12290D Other displaced fracture of third cervical vertebra, subsequent encounter for fracture with routine healing: Secondary | ICD-10-CM | POA: Diagnosis not present

## 2016-10-14 DIAGNOSIS — W109XXD Fall (on) (from) unspecified stairs and steps, subsequent encounter: Secondary | ICD-10-CM | POA: Diagnosis not present

## 2016-10-14 NOTE — Telephone Encounter (Signed)
Called patient to remind her again that prolia is due---patient had to move to [place], will be transferring her care to another provider---patient will ask new pcp to continue prolia injections---patient was due for another injection on/after Aug 01, 2016---I am archiving patient from my prolia portal----can talk with Aaden Buckman if any questions

## 2016-10-15 DIAGNOSIS — S12490D Other displaced fracture of fifth cervical vertebra, subsequent encounter for fracture with routine healing: Secondary | ICD-10-CM | POA: Diagnosis not present

## 2016-10-15 DIAGNOSIS — S12290D Other displaced fracture of third cervical vertebra, subsequent encounter for fracture with routine healing: Secondary | ICD-10-CM | POA: Diagnosis not present

## 2016-10-15 DIAGNOSIS — I2510 Atherosclerotic heart disease of native coronary artery without angina pectoris: Secondary | ICD-10-CM | POA: Diagnosis not present

## 2016-10-15 DIAGNOSIS — W109XXD Fall (on) (from) unspecified stairs and steps, subsequent encounter: Secondary | ICD-10-CM | POA: Diagnosis not present

## 2016-10-15 DIAGNOSIS — S12190D Other displaced fracture of second cervical vertebra, subsequent encounter for fracture with routine healing: Secondary | ICD-10-CM | POA: Diagnosis not present

## 2016-10-15 DIAGNOSIS — Z9181 History of falling: Secondary | ICD-10-CM | POA: Diagnosis not present

## 2016-10-15 DIAGNOSIS — S12530D Unspecified traumatic displaced spondylolisthesis of sixth cervical vertebra, subsequent encounter for fracture with routine healing: Secondary | ICD-10-CM | POA: Diagnosis not present

## 2016-10-15 DIAGNOSIS — S12390D Other displaced fracture of fourth cervical vertebra, subsequent encounter for fracture with routine healing: Secondary | ICD-10-CM | POA: Diagnosis not present

## 2016-10-15 DIAGNOSIS — G2581 Restless legs syndrome: Secondary | ICD-10-CM | POA: Diagnosis not present

## 2016-10-16 DIAGNOSIS — S12530D Unspecified traumatic displaced spondylolisthesis of sixth cervical vertebra, subsequent encounter for fracture with routine healing: Secondary | ICD-10-CM | POA: Diagnosis not present

## 2016-10-16 DIAGNOSIS — S12290D Other displaced fracture of third cervical vertebra, subsequent encounter for fracture with routine healing: Secondary | ICD-10-CM | POA: Diagnosis not present

## 2016-10-16 DIAGNOSIS — S12190D Other displaced fracture of second cervical vertebra, subsequent encounter for fracture with routine healing: Secondary | ICD-10-CM | POA: Diagnosis not present

## 2016-10-16 DIAGNOSIS — Z9181 History of falling: Secondary | ICD-10-CM | POA: Diagnosis not present

## 2016-10-16 DIAGNOSIS — I2510 Atherosclerotic heart disease of native coronary artery without angina pectoris: Secondary | ICD-10-CM | POA: Diagnosis not present

## 2016-10-16 DIAGNOSIS — W109XXD Fall (on) (from) unspecified stairs and steps, subsequent encounter: Secondary | ICD-10-CM | POA: Diagnosis not present

## 2016-10-16 DIAGNOSIS — S12490D Other displaced fracture of fifth cervical vertebra, subsequent encounter for fracture with routine healing: Secondary | ICD-10-CM | POA: Diagnosis not present

## 2016-10-16 DIAGNOSIS — S12390D Other displaced fracture of fourth cervical vertebra, subsequent encounter for fracture with routine healing: Secondary | ICD-10-CM | POA: Diagnosis not present

## 2016-10-16 DIAGNOSIS — G2581 Restless legs syndrome: Secondary | ICD-10-CM | POA: Diagnosis not present

## 2016-10-20 DIAGNOSIS — Z9181 History of falling: Secondary | ICD-10-CM | POA: Diagnosis not present

## 2016-10-20 DIAGNOSIS — S12290D Other displaced fracture of third cervical vertebra, subsequent encounter for fracture with routine healing: Secondary | ICD-10-CM | POA: Diagnosis not present

## 2016-10-20 DIAGNOSIS — G2581 Restless legs syndrome: Secondary | ICD-10-CM | POA: Diagnosis not present

## 2016-10-20 DIAGNOSIS — W109XXD Fall (on) (from) unspecified stairs and steps, subsequent encounter: Secondary | ICD-10-CM | POA: Diagnosis not present

## 2016-10-20 DIAGNOSIS — S12390D Other displaced fracture of fourth cervical vertebra, subsequent encounter for fracture with routine healing: Secondary | ICD-10-CM | POA: Diagnosis not present

## 2016-10-20 DIAGNOSIS — S12530D Unspecified traumatic displaced spondylolisthesis of sixth cervical vertebra, subsequent encounter for fracture with routine healing: Secondary | ICD-10-CM | POA: Diagnosis not present

## 2016-10-20 DIAGNOSIS — S12190D Other displaced fracture of second cervical vertebra, subsequent encounter for fracture with routine healing: Secondary | ICD-10-CM | POA: Diagnosis not present

## 2016-10-20 DIAGNOSIS — I2510 Atherosclerotic heart disease of native coronary artery without angina pectoris: Secondary | ICD-10-CM | POA: Diagnosis not present

## 2016-10-20 DIAGNOSIS — S12490D Other displaced fracture of fifth cervical vertebra, subsequent encounter for fracture with routine healing: Secondary | ICD-10-CM | POA: Diagnosis not present

## 2016-10-21 DIAGNOSIS — S12390D Other displaced fracture of fourth cervical vertebra, subsequent encounter for fracture with routine healing: Secondary | ICD-10-CM | POA: Diagnosis not present

## 2016-10-21 DIAGNOSIS — G2581 Restless legs syndrome: Secondary | ICD-10-CM | POA: Diagnosis not present

## 2016-10-21 DIAGNOSIS — I2510 Atherosclerotic heart disease of native coronary artery without angina pectoris: Secondary | ICD-10-CM | POA: Diagnosis not present

## 2016-10-21 DIAGNOSIS — Z9181 History of falling: Secondary | ICD-10-CM | POA: Diagnosis not present

## 2016-10-21 DIAGNOSIS — S12490D Other displaced fracture of fifth cervical vertebra, subsequent encounter for fracture with routine healing: Secondary | ICD-10-CM | POA: Diagnosis not present

## 2016-10-21 DIAGNOSIS — W109XXD Fall (on) (from) unspecified stairs and steps, subsequent encounter: Secondary | ICD-10-CM | POA: Diagnosis not present

## 2016-10-21 DIAGNOSIS — S12290D Other displaced fracture of third cervical vertebra, subsequent encounter for fracture with routine healing: Secondary | ICD-10-CM | POA: Diagnosis not present

## 2016-10-21 DIAGNOSIS — S12190D Other displaced fracture of second cervical vertebra, subsequent encounter for fracture with routine healing: Secondary | ICD-10-CM | POA: Diagnosis not present

## 2016-10-21 DIAGNOSIS — S12530D Unspecified traumatic displaced spondylolisthesis of sixth cervical vertebra, subsequent encounter for fracture with routine healing: Secondary | ICD-10-CM | POA: Diagnosis not present

## 2016-10-22 DIAGNOSIS — Z9181 History of falling: Secondary | ICD-10-CM | POA: Diagnosis not present

## 2016-10-22 DIAGNOSIS — G2581 Restless legs syndrome: Secondary | ICD-10-CM | POA: Diagnosis not present

## 2016-10-22 DIAGNOSIS — S12490D Other displaced fracture of fifth cervical vertebra, subsequent encounter for fracture with routine healing: Secondary | ICD-10-CM | POA: Diagnosis not present

## 2016-10-22 DIAGNOSIS — W109XXD Fall (on) (from) unspecified stairs and steps, subsequent encounter: Secondary | ICD-10-CM | POA: Diagnosis not present

## 2016-10-22 DIAGNOSIS — S12530D Unspecified traumatic displaced spondylolisthesis of sixth cervical vertebra, subsequent encounter for fracture with routine healing: Secondary | ICD-10-CM | POA: Diagnosis not present

## 2016-10-22 DIAGNOSIS — S12390D Other displaced fracture of fourth cervical vertebra, subsequent encounter for fracture with routine healing: Secondary | ICD-10-CM | POA: Diagnosis not present

## 2016-10-22 DIAGNOSIS — I2510 Atherosclerotic heart disease of native coronary artery without angina pectoris: Secondary | ICD-10-CM | POA: Diagnosis not present

## 2016-10-22 DIAGNOSIS — S12190D Other displaced fracture of second cervical vertebra, subsequent encounter for fracture with routine healing: Secondary | ICD-10-CM | POA: Diagnosis not present

## 2016-10-22 DIAGNOSIS — S12290D Other displaced fracture of third cervical vertebra, subsequent encounter for fracture with routine healing: Secondary | ICD-10-CM | POA: Diagnosis not present

## 2016-10-23 DIAGNOSIS — S129XXA Fracture of neck, unspecified, initial encounter: Secondary | ICD-10-CM | POA: Diagnosis not present

## 2016-10-23 DIAGNOSIS — I10 Essential (primary) hypertension: Secondary | ICD-10-CM | POA: Diagnosis not present

## 2016-10-23 DIAGNOSIS — M542 Cervicalgia: Secondary | ICD-10-CM | POA: Diagnosis not present

## 2016-10-23 DIAGNOSIS — G959 Disease of spinal cord, unspecified: Secondary | ICD-10-CM | POA: Diagnosis not present

## 2016-10-24 DIAGNOSIS — S12390D Other displaced fracture of fourth cervical vertebra, subsequent encounter for fracture with routine healing: Secondary | ICD-10-CM | POA: Diagnosis not present

## 2016-10-24 DIAGNOSIS — S12530D Unspecified traumatic displaced spondylolisthesis of sixth cervical vertebra, subsequent encounter for fracture with routine healing: Secondary | ICD-10-CM | POA: Diagnosis not present

## 2016-10-24 DIAGNOSIS — S12290D Other displaced fracture of third cervical vertebra, subsequent encounter for fracture with routine healing: Secondary | ICD-10-CM | POA: Diagnosis not present

## 2016-10-24 DIAGNOSIS — I2510 Atherosclerotic heart disease of native coronary artery without angina pectoris: Secondary | ICD-10-CM | POA: Diagnosis not present

## 2016-10-24 DIAGNOSIS — S12190D Other displaced fracture of second cervical vertebra, subsequent encounter for fracture with routine healing: Secondary | ICD-10-CM | POA: Diagnosis not present

## 2016-10-24 DIAGNOSIS — Z9181 History of falling: Secondary | ICD-10-CM | POA: Diagnosis not present

## 2016-10-24 DIAGNOSIS — S12490D Other displaced fracture of fifth cervical vertebra, subsequent encounter for fracture with routine healing: Secondary | ICD-10-CM | POA: Diagnosis not present

## 2016-10-24 DIAGNOSIS — W109XXD Fall (on) (from) unspecified stairs and steps, subsequent encounter: Secondary | ICD-10-CM | POA: Diagnosis not present

## 2016-10-24 DIAGNOSIS — G2581 Restless legs syndrome: Secondary | ICD-10-CM | POA: Diagnosis not present

## 2016-10-27 DIAGNOSIS — Z9181 History of falling: Secondary | ICD-10-CM | POA: Diagnosis not present

## 2016-10-27 DIAGNOSIS — I2510 Atherosclerotic heart disease of native coronary artery without angina pectoris: Secondary | ICD-10-CM | POA: Diagnosis not present

## 2016-10-27 DIAGNOSIS — S12290D Other displaced fracture of third cervical vertebra, subsequent encounter for fracture with routine healing: Secondary | ICD-10-CM | POA: Diagnosis not present

## 2016-10-27 DIAGNOSIS — S12190D Other displaced fracture of second cervical vertebra, subsequent encounter for fracture with routine healing: Secondary | ICD-10-CM | POA: Diagnosis not present

## 2016-10-27 DIAGNOSIS — W109XXD Fall (on) (from) unspecified stairs and steps, subsequent encounter: Secondary | ICD-10-CM | POA: Diagnosis not present

## 2016-10-27 DIAGNOSIS — S12390D Other displaced fracture of fourth cervical vertebra, subsequent encounter for fracture with routine healing: Secondary | ICD-10-CM | POA: Diagnosis not present

## 2016-10-27 DIAGNOSIS — S12530D Unspecified traumatic displaced spondylolisthesis of sixth cervical vertebra, subsequent encounter for fracture with routine healing: Secondary | ICD-10-CM | POA: Diagnosis not present

## 2016-10-27 DIAGNOSIS — S12490D Other displaced fracture of fifth cervical vertebra, subsequent encounter for fracture with routine healing: Secondary | ICD-10-CM | POA: Diagnosis not present

## 2016-10-27 DIAGNOSIS — G2581 Restless legs syndrome: Secondary | ICD-10-CM | POA: Diagnosis not present

## 2016-10-28 DIAGNOSIS — I2510 Atherosclerotic heart disease of native coronary artery without angina pectoris: Secondary | ICD-10-CM | POA: Diagnosis not present

## 2016-10-28 DIAGNOSIS — G2581 Restless legs syndrome: Secondary | ICD-10-CM | POA: Diagnosis not present

## 2016-10-28 DIAGNOSIS — Z9181 History of falling: Secondary | ICD-10-CM | POA: Diagnosis not present

## 2016-10-28 DIAGNOSIS — S12390D Other displaced fracture of fourth cervical vertebra, subsequent encounter for fracture with routine healing: Secondary | ICD-10-CM | POA: Diagnosis not present

## 2016-10-28 DIAGNOSIS — S12190D Other displaced fracture of second cervical vertebra, subsequent encounter for fracture with routine healing: Secondary | ICD-10-CM | POA: Diagnosis not present

## 2016-10-28 DIAGNOSIS — S12490D Other displaced fracture of fifth cervical vertebra, subsequent encounter for fracture with routine healing: Secondary | ICD-10-CM | POA: Diagnosis not present

## 2016-10-28 DIAGNOSIS — W109XXD Fall (on) (from) unspecified stairs and steps, subsequent encounter: Secondary | ICD-10-CM | POA: Diagnosis not present

## 2016-10-28 DIAGNOSIS — S12290D Other displaced fracture of third cervical vertebra, subsequent encounter for fracture with routine healing: Secondary | ICD-10-CM | POA: Diagnosis not present

## 2016-10-28 DIAGNOSIS — S12530D Unspecified traumatic displaced spondylolisthesis of sixth cervical vertebra, subsequent encounter for fracture with routine healing: Secondary | ICD-10-CM | POA: Diagnosis not present

## 2016-10-31 DIAGNOSIS — M542 Cervicalgia: Secondary | ICD-10-CM | POA: Diagnosis not present

## 2016-10-31 DIAGNOSIS — S12290D Other displaced fracture of third cervical vertebra, subsequent encounter for fracture with routine healing: Secondary | ICD-10-CM | POA: Diagnosis not present

## 2016-10-31 DIAGNOSIS — S12530D Unspecified traumatic displaced spondylolisthesis of sixth cervical vertebra, subsequent encounter for fracture with routine healing: Secondary | ICD-10-CM | POA: Diagnosis not present

## 2016-10-31 DIAGNOSIS — W109XXD Fall (on) (from) unspecified stairs and steps, subsequent encounter: Secondary | ICD-10-CM | POA: Diagnosis not present

## 2016-10-31 DIAGNOSIS — S12390D Other displaced fracture of fourth cervical vertebra, subsequent encounter for fracture with routine healing: Secondary | ICD-10-CM | POA: Diagnosis not present

## 2016-10-31 DIAGNOSIS — S12190D Other displaced fracture of second cervical vertebra, subsequent encounter for fracture with routine healing: Secondary | ICD-10-CM | POA: Diagnosis not present

## 2016-10-31 DIAGNOSIS — Z9181 History of falling: Secondary | ICD-10-CM | POA: Diagnosis not present

## 2016-10-31 DIAGNOSIS — I2510 Atherosclerotic heart disease of native coronary artery without angina pectoris: Secondary | ICD-10-CM | POA: Diagnosis not present

## 2016-10-31 DIAGNOSIS — S12490D Other displaced fracture of fifth cervical vertebra, subsequent encounter for fracture with routine healing: Secondary | ICD-10-CM | POA: Diagnosis not present

## 2016-10-31 DIAGNOSIS — G2581 Restless legs syndrome: Secondary | ICD-10-CM | POA: Diagnosis not present

## 2016-11-03 DIAGNOSIS — S12390D Other displaced fracture of fourth cervical vertebra, subsequent encounter for fracture with routine healing: Secondary | ICD-10-CM | POA: Diagnosis not present

## 2016-11-03 DIAGNOSIS — W109XXD Fall (on) (from) unspecified stairs and steps, subsequent encounter: Secondary | ICD-10-CM | POA: Diagnosis not present

## 2016-11-03 DIAGNOSIS — I2510 Atherosclerotic heart disease of native coronary artery without angina pectoris: Secondary | ICD-10-CM | POA: Diagnosis not present

## 2016-11-03 DIAGNOSIS — Z9181 History of falling: Secondary | ICD-10-CM | POA: Diagnosis not present

## 2016-11-03 DIAGNOSIS — S12290D Other displaced fracture of third cervical vertebra, subsequent encounter for fracture with routine healing: Secondary | ICD-10-CM | POA: Diagnosis not present

## 2016-11-03 DIAGNOSIS — G2581 Restless legs syndrome: Secondary | ICD-10-CM | POA: Diagnosis not present

## 2016-11-03 DIAGNOSIS — S12190D Other displaced fracture of second cervical vertebra, subsequent encounter for fracture with routine healing: Secondary | ICD-10-CM | POA: Diagnosis not present

## 2016-11-03 DIAGNOSIS — S12490D Other displaced fracture of fifth cervical vertebra, subsequent encounter for fracture with routine healing: Secondary | ICD-10-CM | POA: Diagnosis not present

## 2016-11-03 DIAGNOSIS — S12530D Unspecified traumatic displaced spondylolisthesis of sixth cervical vertebra, subsequent encounter for fracture with routine healing: Secondary | ICD-10-CM | POA: Diagnosis not present

## 2016-11-05 DIAGNOSIS — M542 Cervicalgia: Secondary | ICD-10-CM | POA: Diagnosis not present

## 2016-11-07 DIAGNOSIS — S12530D Unspecified traumatic displaced spondylolisthesis of sixth cervical vertebra, subsequent encounter for fracture with routine healing: Secondary | ICD-10-CM | POA: Diagnosis not present

## 2016-11-07 DIAGNOSIS — S12390D Other displaced fracture of fourth cervical vertebra, subsequent encounter for fracture with routine healing: Secondary | ICD-10-CM | POA: Diagnosis not present

## 2016-11-07 DIAGNOSIS — S12290D Other displaced fracture of third cervical vertebra, subsequent encounter for fracture with routine healing: Secondary | ICD-10-CM | POA: Diagnosis not present

## 2016-11-07 DIAGNOSIS — Z9181 History of falling: Secondary | ICD-10-CM | POA: Diagnosis not present

## 2016-11-07 DIAGNOSIS — S12190D Other displaced fracture of second cervical vertebra, subsequent encounter for fracture with routine healing: Secondary | ICD-10-CM | POA: Diagnosis not present

## 2016-11-07 DIAGNOSIS — S12490D Other displaced fracture of fifth cervical vertebra, subsequent encounter for fracture with routine healing: Secondary | ICD-10-CM | POA: Diagnosis not present

## 2016-11-07 DIAGNOSIS — W109XXD Fall (on) (from) unspecified stairs and steps, subsequent encounter: Secondary | ICD-10-CM | POA: Diagnosis not present

## 2016-11-07 DIAGNOSIS — G2581 Restless legs syndrome: Secondary | ICD-10-CM | POA: Diagnosis not present

## 2016-11-07 DIAGNOSIS — I2510 Atherosclerotic heart disease of native coronary artery without angina pectoris: Secondary | ICD-10-CM | POA: Diagnosis not present

## 2016-11-11 DIAGNOSIS — Z9181 History of falling: Secondary | ICD-10-CM | POA: Diagnosis not present

## 2016-11-11 DIAGNOSIS — S12390D Other displaced fracture of fourth cervical vertebra, subsequent encounter for fracture with routine healing: Secondary | ICD-10-CM | POA: Diagnosis not present

## 2016-11-11 DIAGNOSIS — S12190D Other displaced fracture of second cervical vertebra, subsequent encounter for fracture with routine healing: Secondary | ICD-10-CM | POA: Diagnosis not present

## 2016-11-11 DIAGNOSIS — I2510 Atherosclerotic heart disease of native coronary artery without angina pectoris: Secondary | ICD-10-CM | POA: Diagnosis not present

## 2016-11-11 DIAGNOSIS — S12530D Unspecified traumatic displaced spondylolisthesis of sixth cervical vertebra, subsequent encounter for fracture with routine healing: Secondary | ICD-10-CM | POA: Diagnosis not present

## 2016-11-11 DIAGNOSIS — S12290D Other displaced fracture of third cervical vertebra, subsequent encounter for fracture with routine healing: Secondary | ICD-10-CM | POA: Diagnosis not present

## 2016-11-11 DIAGNOSIS — W109XXD Fall (on) (from) unspecified stairs and steps, subsequent encounter: Secondary | ICD-10-CM | POA: Diagnosis not present

## 2016-11-11 DIAGNOSIS — G2581 Restless legs syndrome: Secondary | ICD-10-CM | POA: Diagnosis not present

## 2016-11-11 DIAGNOSIS — S12490D Other displaced fracture of fifth cervical vertebra, subsequent encounter for fracture with routine healing: Secondary | ICD-10-CM | POA: Diagnosis not present

## 2016-11-11 DIAGNOSIS — I7774 Dissection of vertebral artery: Secondary | ICD-10-CM | POA: Diagnosis not present

## 2016-11-13 DIAGNOSIS — M542 Cervicalgia: Secondary | ICD-10-CM | POA: Diagnosis not present

## 2016-11-13 DIAGNOSIS — G959 Disease of spinal cord, unspecified: Secondary | ICD-10-CM | POA: Diagnosis not present

## 2016-11-13 DIAGNOSIS — S129XXA Fracture of neck, unspecified, initial encounter: Secondary | ICD-10-CM | POA: Diagnosis not present

## 2016-11-17 ENCOUNTER — Telehealth: Payer: Self-pay | Admitting: Internal Medicine

## 2016-11-17 NOTE — Telephone Encounter (Signed)
Advised patient she will need to ask her new pcp about getting prolia at that office, I am not able to get information and/or give injection if her pcp is not in this office, patient repeated back for understanding, she will contact her new pcp to request

## 2016-11-17 NOTE — Telephone Encounter (Signed)
The pt called and said that she was suppose to come in to have a Prolia injection in December but she fell and was not able to come then. She was wanting to see if the information could be sent over to [facility] where she will be receiving care. Please advise.

## 2016-11-20 DIAGNOSIS — Z9181 History of falling: Secondary | ICD-10-CM | POA: Diagnosis not present

## 2016-11-20 DIAGNOSIS — W109XXD Fall (on) (from) unspecified stairs and steps, subsequent encounter: Secondary | ICD-10-CM | POA: Diagnosis not present

## 2016-11-20 DIAGNOSIS — S12190D Other displaced fracture of second cervical vertebra, subsequent encounter for fracture with routine healing: Secondary | ICD-10-CM | POA: Diagnosis not present

## 2016-11-20 DIAGNOSIS — S12530D Unspecified traumatic displaced spondylolisthesis of sixth cervical vertebra, subsequent encounter for fracture with routine healing: Secondary | ICD-10-CM | POA: Diagnosis not present

## 2016-11-20 DIAGNOSIS — S12490D Other displaced fracture of fifth cervical vertebra, subsequent encounter for fracture with routine healing: Secondary | ICD-10-CM | POA: Diagnosis not present

## 2016-11-20 DIAGNOSIS — I2510 Atherosclerotic heart disease of native coronary artery without angina pectoris: Secondary | ICD-10-CM | POA: Diagnosis not present

## 2016-11-20 DIAGNOSIS — S12390D Other displaced fracture of fourth cervical vertebra, subsequent encounter for fracture with routine healing: Secondary | ICD-10-CM | POA: Diagnosis not present

## 2016-11-20 DIAGNOSIS — G2581 Restless legs syndrome: Secondary | ICD-10-CM | POA: Diagnosis not present

## 2016-11-20 DIAGNOSIS — S12290D Other displaced fracture of third cervical vertebra, subsequent encounter for fracture with routine healing: Secondary | ICD-10-CM | POA: Diagnosis not present

## 2016-11-21 DIAGNOSIS — I2510 Atherosclerotic heart disease of native coronary artery without angina pectoris: Secondary | ICD-10-CM | POA: Diagnosis not present

## 2016-11-21 DIAGNOSIS — Z9181 History of falling: Secondary | ICD-10-CM | POA: Diagnosis not present

## 2016-11-21 DIAGNOSIS — G2581 Restless legs syndrome: Secondary | ICD-10-CM | POA: Diagnosis not present

## 2016-11-21 DIAGNOSIS — W109XXD Fall (on) (from) unspecified stairs and steps, subsequent encounter: Secondary | ICD-10-CM | POA: Diagnosis not present

## 2016-11-21 DIAGNOSIS — S12530D Unspecified traumatic displaced spondylolisthesis of sixth cervical vertebra, subsequent encounter for fracture with routine healing: Secondary | ICD-10-CM | POA: Diagnosis not present

## 2016-11-21 DIAGNOSIS — S12490D Other displaced fracture of fifth cervical vertebra, subsequent encounter for fracture with routine healing: Secondary | ICD-10-CM | POA: Diagnosis not present

## 2016-11-21 DIAGNOSIS — S12190D Other displaced fracture of second cervical vertebra, subsequent encounter for fracture with routine healing: Secondary | ICD-10-CM | POA: Diagnosis not present

## 2016-11-21 DIAGNOSIS — S12290D Other displaced fracture of third cervical vertebra, subsequent encounter for fracture with routine healing: Secondary | ICD-10-CM | POA: Diagnosis not present

## 2016-11-21 DIAGNOSIS — S12390D Other displaced fracture of fourth cervical vertebra, subsequent encounter for fracture with routine healing: Secondary | ICD-10-CM | POA: Diagnosis not present

## 2016-11-26 DIAGNOSIS — W109XXD Fall (on) (from) unspecified stairs and steps, subsequent encounter: Secondary | ICD-10-CM | POA: Diagnosis not present

## 2016-11-26 DIAGNOSIS — S12490D Other displaced fracture of fifth cervical vertebra, subsequent encounter for fracture with routine healing: Secondary | ICD-10-CM | POA: Diagnosis not present

## 2016-11-26 DIAGNOSIS — E7800 Pure hypercholesterolemia, unspecified: Secondary | ICD-10-CM | POA: Diagnosis not present

## 2016-11-26 DIAGNOSIS — Z9181 History of falling: Secondary | ICD-10-CM | POA: Diagnosis not present

## 2016-11-26 DIAGNOSIS — S12500A Unspecified displaced fracture of sixth cervical vertebra, initial encounter for closed fracture: Secondary | ICD-10-CM | POA: Diagnosis not present

## 2016-11-26 DIAGNOSIS — I2510 Atherosclerotic heart disease of native coronary artery without angina pectoris: Secondary | ICD-10-CM | POA: Diagnosis not present

## 2016-11-26 DIAGNOSIS — S12290D Other displaced fracture of third cervical vertebra, subsequent encounter for fracture with routine healing: Secondary | ICD-10-CM | POA: Diagnosis not present

## 2016-11-26 DIAGNOSIS — S12390D Other displaced fracture of fourth cervical vertebra, subsequent encounter for fracture with routine healing: Secondary | ICD-10-CM | POA: Diagnosis not present

## 2016-11-26 DIAGNOSIS — I10 Essential (primary) hypertension: Secondary | ICD-10-CM | POA: Diagnosis not present

## 2016-11-26 DIAGNOSIS — S12530D Unspecified traumatic displaced spondylolisthesis of sixth cervical vertebra, subsequent encounter for fracture with routine healing: Secondary | ICD-10-CM | POA: Diagnosis not present

## 2016-11-26 DIAGNOSIS — S12190D Other displaced fracture of second cervical vertebra, subsequent encounter for fracture with routine healing: Secondary | ICD-10-CM | POA: Diagnosis not present

## 2016-11-26 DIAGNOSIS — G2581 Restless legs syndrome: Secondary | ICD-10-CM | POA: Diagnosis not present

## 2016-11-29 DIAGNOSIS — S12390D Other displaced fracture of fourth cervical vertebra, subsequent encounter for fracture with routine healing: Secondary | ICD-10-CM | POA: Diagnosis not present

## 2016-11-29 DIAGNOSIS — I2510 Atherosclerotic heart disease of native coronary artery without angina pectoris: Secondary | ICD-10-CM | POA: Diagnosis not present

## 2016-11-29 DIAGNOSIS — S12290D Other displaced fracture of third cervical vertebra, subsequent encounter for fracture with routine healing: Secondary | ICD-10-CM | POA: Diagnosis not present

## 2016-11-29 DIAGNOSIS — W109XXD Fall (on) (from) unspecified stairs and steps, subsequent encounter: Secondary | ICD-10-CM | POA: Diagnosis not present

## 2016-11-29 DIAGNOSIS — S12190D Other displaced fracture of second cervical vertebra, subsequent encounter for fracture with routine healing: Secondary | ICD-10-CM | POA: Diagnosis not present

## 2016-11-29 DIAGNOSIS — S12530D Unspecified traumatic displaced spondylolisthesis of sixth cervical vertebra, subsequent encounter for fracture with routine healing: Secondary | ICD-10-CM | POA: Diagnosis not present

## 2016-11-29 DIAGNOSIS — Z9181 History of falling: Secondary | ICD-10-CM | POA: Diagnosis not present

## 2016-11-29 DIAGNOSIS — G2581 Restless legs syndrome: Secondary | ICD-10-CM | POA: Diagnosis not present

## 2016-11-29 DIAGNOSIS — S12490D Other displaced fracture of fifth cervical vertebra, subsequent encounter for fracture with routine healing: Secondary | ICD-10-CM | POA: Diagnosis not present

## 2016-12-03 DIAGNOSIS — I2510 Atherosclerotic heart disease of native coronary artery without angina pectoris: Secondary | ICD-10-CM | POA: Diagnosis not present

## 2016-12-03 DIAGNOSIS — I10 Essential (primary) hypertension: Secondary | ICD-10-CM | POA: Diagnosis not present

## 2016-12-03 DIAGNOSIS — G2581 Restless legs syndrome: Secondary | ICD-10-CM | POA: Diagnosis not present

## 2016-12-03 DIAGNOSIS — Z951 Presence of aortocoronary bypass graft: Secondary | ICD-10-CM | POA: Diagnosis not present

## 2016-12-03 DIAGNOSIS — S12190D Other displaced fracture of second cervical vertebra, subsequent encounter for fracture with routine healing: Secondary | ICD-10-CM | POA: Diagnosis not present

## 2016-12-03 DIAGNOSIS — S12490D Other displaced fracture of fifth cervical vertebra, subsequent encounter for fracture with routine healing: Secondary | ICD-10-CM | POA: Diagnosis not present

## 2016-12-03 DIAGNOSIS — S12390D Other displaced fracture of fourth cervical vertebra, subsequent encounter for fracture with routine healing: Secondary | ICD-10-CM | POA: Diagnosis not present

## 2016-12-03 DIAGNOSIS — E784 Other hyperlipidemia: Secondary | ICD-10-CM | POA: Diagnosis not present

## 2016-12-03 DIAGNOSIS — S12530D Unspecified traumatic displaced spondylolisthesis of sixth cervical vertebra, subsequent encounter for fracture with routine healing: Secondary | ICD-10-CM | POA: Diagnosis not present

## 2016-12-03 DIAGNOSIS — Z9181 History of falling: Secondary | ICD-10-CM | POA: Diagnosis not present

## 2016-12-03 DIAGNOSIS — R011 Cardiac murmur, unspecified: Secondary | ICD-10-CM | POA: Diagnosis not present

## 2016-12-03 DIAGNOSIS — W109XXD Fall (on) (from) unspecified stairs and steps, subsequent encounter: Secondary | ICD-10-CM | POA: Diagnosis not present

## 2016-12-03 DIAGNOSIS — S12290D Other displaced fracture of third cervical vertebra, subsequent encounter for fracture with routine healing: Secondary | ICD-10-CM | POA: Diagnosis not present

## 2016-12-22 DIAGNOSIS — Z951 Presence of aortocoronary bypass graft: Secondary | ICD-10-CM | POA: Diagnosis not present

## 2016-12-22 DIAGNOSIS — I088 Other rheumatic multiple valve diseases: Secondary | ICD-10-CM | POA: Diagnosis not present

## 2016-12-22 DIAGNOSIS — I2510 Atherosclerotic heart disease of native coronary artery without angina pectoris: Secondary | ICD-10-CM | POA: Diagnosis not present

## 2016-12-22 DIAGNOSIS — I371 Nonrheumatic pulmonary valve insufficiency: Secondary | ICD-10-CM | POA: Diagnosis not present

## 2016-12-22 DIAGNOSIS — I083 Combined rheumatic disorders of mitral, aortic and tricuspid valves: Secondary | ICD-10-CM | POA: Diagnosis not present

## 2016-12-22 DIAGNOSIS — R011 Cardiac murmur, unspecified: Secondary | ICD-10-CM | POA: Diagnosis not present

## 2016-12-22 DIAGNOSIS — R9439 Abnormal result of other cardiovascular function study: Secondary | ICD-10-CM | POA: Diagnosis not present

## 2017-01-01 DIAGNOSIS — Z951 Presence of aortocoronary bypass graft: Secondary | ICD-10-CM | POA: Diagnosis not present

## 2017-01-01 DIAGNOSIS — R011 Cardiac murmur, unspecified: Secondary | ICD-10-CM | POA: Diagnosis not present

## 2017-01-01 DIAGNOSIS — Z01810 Encounter for preprocedural cardiovascular examination: Secondary | ICD-10-CM | POA: Diagnosis not present

## 2017-01-01 DIAGNOSIS — I10 Essential (primary) hypertension: Secondary | ICD-10-CM | POA: Diagnosis not present

## 2017-01-07 ENCOUNTER — Telehealth: Payer: Self-pay | Admitting: Internal Medicine

## 2017-01-07 DIAGNOSIS — G4733 Obstructive sleep apnea (adult) (pediatric): Secondary | ICD-10-CM | POA: Diagnosis not present

## 2017-01-07 NOTE — Telephone Encounter (Signed)
Called pt to schedule awv. Lvm for pt to call office to schedule appt.  °

## 2017-01-09 DIAGNOSIS — S129XXA Fracture of neck, unspecified, initial encounter: Secondary | ICD-10-CM | POA: Diagnosis not present

## 2017-01-09 DIAGNOSIS — G959 Disease of spinal cord, unspecified: Secondary | ICD-10-CM | POA: Diagnosis not present

## 2017-01-09 DIAGNOSIS — M542 Cervicalgia: Secondary | ICD-10-CM | POA: Diagnosis not present

## 2017-01-17 DIAGNOSIS — I10 Essential (primary) hypertension: Secondary | ICD-10-CM | POA: Diagnosis not present

## 2017-01-17 DIAGNOSIS — Z882 Allergy status to sulfonamides status: Secondary | ICD-10-CM | POA: Diagnosis not present

## 2017-01-17 DIAGNOSIS — Z951 Presence of aortocoronary bypass graft: Secondary | ICD-10-CM | POA: Diagnosis not present

## 2017-01-17 DIAGNOSIS — S0191XA Laceration without foreign body of unspecified part of head, initial encounter: Secondary | ICD-10-CM | POA: Diagnosis not present

## 2017-01-17 DIAGNOSIS — Z79899 Other long term (current) drug therapy: Secondary | ICD-10-CM | POA: Diagnosis not present

## 2017-01-17 DIAGNOSIS — T1490XA Injury, unspecified, initial encounter: Secondary | ICD-10-CM | POA: Diagnosis not present

## 2017-01-17 DIAGNOSIS — S0101XA Laceration without foreign body of scalp, initial encounter: Secondary | ICD-10-CM | POA: Diagnosis not present

## 2017-01-17 DIAGNOSIS — S0181XA Laceration without foreign body of other part of head, initial encounter: Secondary | ICD-10-CM | POA: Diagnosis not present

## 2017-01-17 DIAGNOSIS — Z7982 Long term (current) use of aspirin: Secondary | ICD-10-CM | POA: Diagnosis not present

## 2017-01-17 DIAGNOSIS — S0990XA Unspecified injury of head, initial encounter: Secondary | ICD-10-CM | POA: Diagnosis not present

## 2017-01-17 DIAGNOSIS — W19XXXA Unspecified fall, initial encounter: Secondary | ICD-10-CM | POA: Diagnosis not present

## 2017-01-17 DIAGNOSIS — S0093XA Contusion of unspecified part of head, initial encounter: Secondary | ICD-10-CM | POA: Diagnosis not present

## 2017-01-20 ENCOUNTER — Other Ambulatory Visit: Payer: Self-pay | Admitting: Internal Medicine

## 2017-01-23 ENCOUNTER — Ambulatory Visit: Payer: Medicare Other | Admitting: Internal Medicine

## 2017-01-26 DIAGNOSIS — Z4802 Encounter for removal of sutures: Secondary | ICD-10-CM | POA: Diagnosis not present

## 2017-01-26 DIAGNOSIS — S0912XA Laceration of muscle and tendon of head, initial encounter: Secondary | ICD-10-CM | POA: Diagnosis not present

## 2017-01-28 DIAGNOSIS — G959 Disease of spinal cord, unspecified: Secondary | ICD-10-CM | POA: Diagnosis not present

## 2017-01-28 DIAGNOSIS — S12591D Other nondisplaced fracture of sixth cervical vertebra, subsequent encounter for fracture with routine healing: Secondary | ICD-10-CM | POA: Diagnosis not present

## 2017-01-28 DIAGNOSIS — M542 Cervicalgia: Secondary | ICD-10-CM | POA: Diagnosis not present

## 2017-02-02 DIAGNOSIS — S0101XA Laceration without foreign body of scalp, initial encounter: Secondary | ICD-10-CM | POA: Diagnosis not present

## 2017-02-14 ENCOUNTER — Other Ambulatory Visit: Payer: Self-pay | Admitting: Internal Medicine

## 2017-03-05 DIAGNOSIS — Z01818 Encounter for other preprocedural examination: Secondary | ICD-10-CM | POA: Diagnosis not present

## 2017-03-05 DIAGNOSIS — Z951 Presence of aortocoronary bypass graft: Secondary | ICD-10-CM | POA: Diagnosis not present

## 2017-03-05 DIAGNOSIS — I10 Essential (primary) hypertension: Secondary | ICD-10-CM | POA: Diagnosis not present

## 2017-03-13 DIAGNOSIS — G959 Disease of spinal cord, unspecified: Secondary | ICD-10-CM | POA: Diagnosis not present

## 2017-03-13 DIAGNOSIS — Z7982 Long term (current) use of aspirin: Secondary | ICD-10-CM | POA: Diagnosis not present

## 2017-03-13 DIAGNOSIS — I110 Hypertensive heart disease with heart failure: Secondary | ICD-10-CM | POA: Diagnosis not present

## 2017-03-13 DIAGNOSIS — S12590S Other displaced fracture of sixth cervical vertebra, sequela: Secondary | ICD-10-CM | POA: Diagnosis not present

## 2017-03-13 DIAGNOSIS — I2510 Atherosclerotic heart disease of native coronary artery without angina pectoris: Secondary | ICD-10-CM | POA: Diagnosis not present

## 2017-03-13 DIAGNOSIS — S12000K Unspecified displaced fracture of first cervical vertebra, subsequent encounter for fracture with nonunion: Secondary | ICD-10-CM | POA: Diagnosis not present

## 2017-03-13 DIAGNOSIS — Z853 Personal history of malignant neoplasm of breast: Secondary | ICD-10-CM | POA: Diagnosis not present

## 2017-03-13 DIAGNOSIS — M4712 Other spondylosis with myelopathy, cervical region: Secondary | ICD-10-CM | POA: Diagnosis not present

## 2017-03-13 DIAGNOSIS — M4802 Spinal stenosis, cervical region: Secondary | ICD-10-CM | POA: Diagnosis not present

## 2017-03-13 DIAGNOSIS — Z79899 Other long term (current) drug therapy: Secondary | ICD-10-CM | POA: Diagnosis not present

## 2017-03-13 DIAGNOSIS — Z951 Presence of aortocoronary bypass graft: Secondary | ICD-10-CM | POA: Diagnosis not present

## 2017-03-13 DIAGNOSIS — E785 Hyperlipidemia, unspecified: Secondary | ICD-10-CM | POA: Diagnosis not present

## 2017-03-13 DIAGNOSIS — S12500A Unspecified displaced fracture of sixth cervical vertebra, initial encounter for closed fracture: Secondary | ICD-10-CM | POA: Diagnosis not present

## 2017-03-13 DIAGNOSIS — S12591D Other nondisplaced fracture of sixth cervical vertebra, subsequent encounter for fracture with routine healing: Secondary | ICD-10-CM | POA: Diagnosis not present

## 2017-03-13 DIAGNOSIS — I5022 Chronic systolic (congestive) heart failure: Secondary | ICD-10-CM | POA: Diagnosis not present

## 2017-03-17 DIAGNOSIS — I110 Hypertensive heart disease with heart failure: Secondary | ICD-10-CM | POA: Diagnosis not present

## 2017-03-17 DIAGNOSIS — Z9181 History of falling: Secondary | ICD-10-CM | POA: Diagnosis not present

## 2017-03-17 DIAGNOSIS — I2510 Atherosclerotic heart disease of native coronary artery without angina pectoris: Secondary | ICD-10-CM | POA: Diagnosis not present

## 2017-03-17 DIAGNOSIS — Z4789 Encounter for other orthopedic aftercare: Secondary | ICD-10-CM | POA: Diagnosis not present

## 2017-03-17 DIAGNOSIS — R531 Weakness: Secondary | ICD-10-CM | POA: Diagnosis not present

## 2017-03-17 DIAGNOSIS — Z79891 Long term (current) use of opiate analgesic: Secondary | ICD-10-CM | POA: Diagnosis not present

## 2017-03-17 DIAGNOSIS — Z7982 Long term (current) use of aspirin: Secondary | ICD-10-CM | POA: Diagnosis not present

## 2017-03-17 DIAGNOSIS — R011 Cardiac murmur, unspecified: Secondary | ICD-10-CM | POA: Diagnosis not present

## 2017-03-17 DIAGNOSIS — Z951 Presence of aortocoronary bypass graft: Secondary | ICD-10-CM | POA: Diagnosis not present

## 2017-03-17 DIAGNOSIS — I429 Cardiomyopathy, unspecified: Secondary | ICD-10-CM | POA: Diagnosis not present

## 2017-03-17 DIAGNOSIS — R269 Unspecified abnormalities of gait and mobility: Secondary | ICD-10-CM | POA: Diagnosis not present

## 2017-03-17 DIAGNOSIS — I5022 Chronic systolic (congestive) heart failure: Secondary | ICD-10-CM | POA: Diagnosis not present

## 2017-03-19 DIAGNOSIS — S12590A Other displaced fracture of sixth cervical vertebra, initial encounter for closed fracture: Secondary | ICD-10-CM | POA: Diagnosis not present

## 2017-03-19 DIAGNOSIS — S12600A Unspecified displaced fracture of seventh cervical vertebra, initial encounter for closed fracture: Secondary | ICD-10-CM | POA: Diagnosis not present

## 2017-03-19 DIAGNOSIS — Z7982 Long term (current) use of aspirin: Secondary | ICD-10-CM | POA: Diagnosis not present

## 2017-03-19 DIAGNOSIS — R402363 Coma scale, best motor response, obeys commands, at hospital admission: Secondary | ICD-10-CM | POA: Diagnosis not present

## 2017-03-19 DIAGNOSIS — Z951 Presence of aortocoronary bypass graft: Secondary | ICD-10-CM | POA: Diagnosis not present

## 2017-03-19 DIAGNOSIS — I5023 Acute on chronic systolic (congestive) heart failure: Secondary | ICD-10-CM | POA: Diagnosis not present

## 2017-03-19 DIAGNOSIS — R7989 Other specified abnormal findings of blood chemistry: Secondary | ICD-10-CM | POA: Diagnosis not present

## 2017-03-19 DIAGNOSIS — Z743 Need for continuous supervision: Secondary | ICD-10-CM | POA: Diagnosis not present

## 2017-03-19 DIAGNOSIS — S12590D Other displaced fracture of sixth cervical vertebra, subsequent encounter for fracture with routine healing: Secondary | ICD-10-CM | POA: Diagnosis not present

## 2017-03-19 DIAGNOSIS — E785 Hyperlipidemia, unspecified: Secondary | ICD-10-CM | POA: Diagnosis not present

## 2017-03-19 DIAGNOSIS — R402143 Coma scale, eyes open, spontaneous, at hospital admission: Secondary | ICD-10-CM | POA: Diagnosis not present

## 2017-03-19 DIAGNOSIS — E876 Hypokalemia: Secondary | ICD-10-CM | POA: Diagnosis not present

## 2017-03-19 DIAGNOSIS — I083 Combined rheumatic disorders of mitral, aortic and tricuspid valves: Secondary | ICD-10-CM | POA: Diagnosis not present

## 2017-03-19 DIAGNOSIS — I509 Heart failure, unspecified: Secondary | ICD-10-CM | POA: Diagnosis not present

## 2017-03-19 DIAGNOSIS — Z79899 Other long term (current) drug therapy: Secondary | ICD-10-CM | POA: Diagnosis not present

## 2017-03-19 DIAGNOSIS — I10 Essential (primary) hypertension: Secondary | ICD-10-CM | POA: Diagnosis not present

## 2017-03-19 DIAGNOSIS — Z9889 Other specified postprocedural states: Secondary | ICD-10-CM | POA: Diagnosis not present

## 2017-03-19 DIAGNOSIS — R0902 Hypoxemia: Secondary | ICD-10-CM | POA: Diagnosis not present

## 2017-03-19 DIAGNOSIS — Z981 Arthrodesis status: Secondary | ICD-10-CM | POA: Diagnosis not present

## 2017-03-19 DIAGNOSIS — S12490D Other displaced fracture of fifth cervical vertebra, subsequent encounter for fracture with routine healing: Secondary | ICD-10-CM | POA: Diagnosis not present

## 2017-03-19 DIAGNOSIS — Z4689 Encounter for fitting and adjustment of other specified devices: Secondary | ICD-10-CM | POA: Diagnosis not present

## 2017-03-19 DIAGNOSIS — I6782 Cerebral ischemia: Secondary | ICD-10-CM | POA: Diagnosis not present

## 2017-03-19 DIAGNOSIS — Z803 Family history of malignant neoplasm of breast: Secondary | ICD-10-CM | POA: Diagnosis not present

## 2017-03-19 DIAGNOSIS — I272 Pulmonary hypertension, unspecified: Secondary | ICD-10-CM | POA: Diagnosis not present

## 2017-03-19 DIAGNOSIS — S12690D Other displaced fracture of seventh cervical vertebra, subsequent encounter for fracture with routine healing: Secondary | ICD-10-CM | POA: Diagnosis not present

## 2017-03-19 DIAGNOSIS — R402243 Coma scale, best verbal response, confused conversation, at hospital admission: Secondary | ICD-10-CM | POA: Diagnosis not present

## 2017-03-19 DIAGNOSIS — Z882 Allergy status to sulfonamides status: Secondary | ICD-10-CM | POA: Diagnosis not present

## 2017-03-19 DIAGNOSIS — R0781 Pleurodynia: Secondary | ICD-10-CM | POA: Diagnosis not present

## 2017-03-19 DIAGNOSIS — R296 Repeated falls: Secondary | ICD-10-CM | POA: Diagnosis not present

## 2017-03-19 DIAGNOSIS — S12490A Other displaced fracture of fifth cervical vertebra, initial encounter for closed fracture: Secondary | ICD-10-CM | POA: Diagnosis not present

## 2017-03-19 DIAGNOSIS — M542 Cervicalgia: Secondary | ICD-10-CM | POA: Diagnosis not present

## 2017-03-19 DIAGNOSIS — S129XXA Fracture of neck, unspecified, initial encounter: Secondary | ICD-10-CM | POA: Diagnosis not present

## 2017-03-19 DIAGNOSIS — S0093XA Contusion of unspecified part of head, initial encounter: Secondary | ICD-10-CM | POA: Diagnosis not present

## 2017-03-19 DIAGNOSIS — J810 Acute pulmonary edema: Secondary | ICD-10-CM | POA: Diagnosis not present

## 2017-03-19 DIAGNOSIS — S1191XA Laceration without foreign body of unspecified part of neck, initial encounter: Secondary | ICD-10-CM | POA: Diagnosis not present

## 2017-03-19 DIAGNOSIS — Z9181 History of falling: Secondary | ICD-10-CM | POA: Diagnosis not present

## 2017-03-19 DIAGNOSIS — I255 Ischemic cardiomyopathy: Secondary | ICD-10-CM | POA: Diagnosis not present

## 2017-03-19 DIAGNOSIS — I110 Hypertensive heart disease with heart failure: Secondary | ICD-10-CM | POA: Diagnosis not present

## 2017-03-19 DIAGNOSIS — S0990XA Unspecified injury of head, initial encounter: Secondary | ICD-10-CM | POA: Diagnosis not present

## 2017-03-19 DIAGNOSIS — I2510 Atherosclerotic heart disease of native coronary artery without angina pectoris: Secondary | ICD-10-CM | POA: Diagnosis not present

## 2017-03-19 DIAGNOSIS — M4312 Spondylolisthesis, cervical region: Secondary | ICD-10-CM | POA: Diagnosis not present

## 2017-03-23 DIAGNOSIS — S12490D Other displaced fracture of fifth cervical vertebra, subsequent encounter for fracture with routine healing: Secondary | ICD-10-CM | POA: Diagnosis not present

## 2017-03-23 DIAGNOSIS — Z9889 Other specified postprocedural states: Secondary | ICD-10-CM | POA: Diagnosis not present

## 2017-03-23 DIAGNOSIS — M542 Cervicalgia: Secondary | ICD-10-CM | POA: Diagnosis not present

## 2017-03-23 DIAGNOSIS — Z981 Arthrodesis status: Secondary | ICD-10-CM | POA: Diagnosis not present

## 2017-03-23 DIAGNOSIS — Z8679 Personal history of other diseases of the circulatory system: Secondary | ICD-10-CM | POA: Diagnosis not present

## 2017-03-23 DIAGNOSIS — S129XXA Fracture of neck, unspecified, initial encounter: Secondary | ICD-10-CM | POA: Diagnosis not present

## 2017-03-23 DIAGNOSIS — I5020 Unspecified systolic (congestive) heart failure: Secondary | ICD-10-CM | POA: Diagnosis not present

## 2017-03-23 DIAGNOSIS — I272 Pulmonary hypertension, unspecified: Secondary | ICD-10-CM | POA: Diagnosis not present

## 2017-03-23 DIAGNOSIS — R296 Repeated falls: Secondary | ICD-10-CM | POA: Diagnosis not present

## 2017-03-23 DIAGNOSIS — Z4689 Encounter for fitting and adjustment of other specified devices: Secondary | ICD-10-CM | POA: Diagnosis not present

## 2017-03-23 DIAGNOSIS — S12690D Other displaced fracture of seventh cervical vertebra, subsequent encounter for fracture with routine healing: Secondary | ICD-10-CM | POA: Diagnosis not present

## 2017-03-23 DIAGNOSIS — S12500A Unspecified displaced fracture of sixth cervical vertebra, initial encounter for closed fracture: Secondary | ICD-10-CM | POA: Diagnosis not present

## 2017-03-23 DIAGNOSIS — R011 Cardiac murmur, unspecified: Secondary | ICD-10-CM | POA: Diagnosis not present

## 2017-03-23 DIAGNOSIS — I10 Essential (primary) hypertension: Secondary | ICD-10-CM | POA: Diagnosis not present

## 2017-03-23 DIAGNOSIS — Z951 Presence of aortocoronary bypass graft: Secondary | ICD-10-CM | POA: Diagnosis not present

## 2017-03-23 DIAGNOSIS — I2510 Atherosclerotic heart disease of native coronary artery without angina pectoris: Secondary | ICD-10-CM | POA: Diagnosis not present

## 2017-03-23 DIAGNOSIS — I509 Heart failure, unspecified: Secondary | ICD-10-CM | POA: Diagnosis not present

## 2017-03-23 DIAGNOSIS — R5381 Other malaise: Secondary | ICD-10-CM | POA: Diagnosis not present

## 2017-03-23 DIAGNOSIS — I429 Cardiomyopathy, unspecified: Secondary | ICD-10-CM | POA: Diagnosis not present

## 2017-03-23 DIAGNOSIS — S12590D Other displaced fracture of sixth cervical vertebra, subsequent encounter for fracture with routine healing: Secondary | ICD-10-CM | POA: Diagnosis not present

## 2017-03-23 DIAGNOSIS — M4312 Spondylolisthesis, cervical region: Secondary | ICD-10-CM | POA: Diagnosis not present

## 2017-03-24 DIAGNOSIS — R5381 Other malaise: Secondary | ICD-10-CM | POA: Diagnosis not present

## 2017-03-24 DIAGNOSIS — I429 Cardiomyopathy, unspecified: Secondary | ICD-10-CM | POA: Diagnosis not present

## 2017-03-24 DIAGNOSIS — S12500A Unspecified displaced fracture of sixth cervical vertebra, initial encounter for closed fracture: Secondary | ICD-10-CM | POA: Diagnosis not present

## 2017-03-31 DIAGNOSIS — I429 Cardiomyopathy, unspecified: Secondary | ICD-10-CM | POA: Diagnosis not present

## 2017-03-31 DIAGNOSIS — I5020 Unspecified systolic (congestive) heart failure: Secondary | ICD-10-CM | POA: Diagnosis not present

## 2017-03-31 DIAGNOSIS — R011 Cardiac murmur, unspecified: Secondary | ICD-10-CM | POA: Diagnosis not present

## 2017-03-31 DIAGNOSIS — Z951 Presence of aortocoronary bypass graft: Secondary | ICD-10-CM | POA: Diagnosis not present

## 2017-03-31 DIAGNOSIS — I10 Essential (primary) hypertension: Secondary | ICD-10-CM | POA: Diagnosis not present

## 2017-03-31 DIAGNOSIS — R5381 Other malaise: Secondary | ICD-10-CM | POA: Diagnosis not present

## 2017-03-31 NOTE — Telephone Encounter (Signed)
error 

## 2017-04-01 DIAGNOSIS — M542 Cervicalgia: Secondary | ICD-10-CM | POA: Diagnosis not present

## 2017-04-07 DIAGNOSIS — I429 Cardiomyopathy, unspecified: Secondary | ICD-10-CM | POA: Diagnosis not present

## 2017-04-07 DIAGNOSIS — R5381 Other malaise: Secondary | ICD-10-CM | POA: Diagnosis not present

## 2017-04-08 DIAGNOSIS — S129XXA Fracture of neck, unspecified, initial encounter: Secondary | ICD-10-CM | POA: Diagnosis not present

## 2017-04-08 DIAGNOSIS — Z8679 Personal history of other diseases of the circulatory system: Secondary | ICD-10-CM | POA: Diagnosis not present

## 2017-04-08 DIAGNOSIS — R5381 Other malaise: Secondary | ICD-10-CM | POA: Diagnosis not present

## 2017-04-11 DIAGNOSIS — I509 Heart failure, unspecified: Secondary | ICD-10-CM | POA: Diagnosis not present

## 2017-04-11 DIAGNOSIS — Z981 Arthrodesis status: Secondary | ICD-10-CM | POA: Diagnosis not present

## 2017-04-11 DIAGNOSIS — I2510 Atherosclerotic heart disease of native coronary artery without angina pectoris: Secondary | ICD-10-CM | POA: Diagnosis not present

## 2017-04-11 DIAGNOSIS — I110 Hypertensive heart disease with heart failure: Secondary | ICD-10-CM | POA: Diagnosis not present

## 2017-04-11 DIAGNOSIS — S12590D Other displaced fracture of sixth cervical vertebra, subsequent encounter for fracture with routine healing: Secondary | ICD-10-CM | POA: Diagnosis not present

## 2017-04-11 DIAGNOSIS — W19XXXD Unspecified fall, subsequent encounter: Secondary | ICD-10-CM | POA: Diagnosis not present

## 2017-04-11 DIAGNOSIS — Z9181 History of falling: Secondary | ICD-10-CM | POA: Diagnosis not present

## 2017-04-11 DIAGNOSIS — S12690D Other displaced fracture of seventh cervical vertebra, subsequent encounter for fracture with routine healing: Secondary | ICD-10-CM | POA: Diagnosis not present

## 2017-04-11 DIAGNOSIS — R296 Repeated falls: Secondary | ICD-10-CM | POA: Diagnosis not present

## 2017-04-11 DIAGNOSIS — Z7982 Long term (current) use of aspirin: Secondary | ICD-10-CM | POA: Diagnosis not present

## 2017-04-11 DIAGNOSIS — S12490D Other displaced fracture of fifth cervical vertebra, subsequent encounter for fracture with routine healing: Secondary | ICD-10-CM | POA: Diagnosis not present

## 2017-04-14 DIAGNOSIS — S12490D Other displaced fracture of fifth cervical vertebra, subsequent encounter for fracture with routine healing: Secondary | ICD-10-CM | POA: Diagnosis not present

## 2017-04-14 DIAGNOSIS — I110 Hypertensive heart disease with heart failure: Secondary | ICD-10-CM | POA: Diagnosis not present

## 2017-04-14 DIAGNOSIS — Z7982 Long term (current) use of aspirin: Secondary | ICD-10-CM | POA: Diagnosis not present

## 2017-04-14 DIAGNOSIS — S12690D Other displaced fracture of seventh cervical vertebra, subsequent encounter for fracture with routine healing: Secondary | ICD-10-CM | POA: Diagnosis not present

## 2017-04-14 DIAGNOSIS — I509 Heart failure, unspecified: Secondary | ICD-10-CM | POA: Diagnosis not present

## 2017-04-14 DIAGNOSIS — S12590D Other displaced fracture of sixth cervical vertebra, subsequent encounter for fracture with routine healing: Secondary | ICD-10-CM | POA: Diagnosis not present

## 2017-04-14 DIAGNOSIS — W19XXXD Unspecified fall, subsequent encounter: Secondary | ICD-10-CM | POA: Diagnosis not present

## 2017-04-14 DIAGNOSIS — I2510 Atherosclerotic heart disease of native coronary artery without angina pectoris: Secondary | ICD-10-CM | POA: Diagnosis not present

## 2017-04-14 DIAGNOSIS — Z9181 History of falling: Secondary | ICD-10-CM | POA: Diagnosis not present

## 2017-04-14 DIAGNOSIS — Z981 Arthrodesis status: Secondary | ICD-10-CM | POA: Diagnosis not present

## 2017-04-14 DIAGNOSIS — R296 Repeated falls: Secondary | ICD-10-CM | POA: Diagnosis not present

## 2017-04-15 DIAGNOSIS — W19XXXD Unspecified fall, subsequent encounter: Secondary | ICD-10-CM | POA: Diagnosis not present

## 2017-04-15 DIAGNOSIS — R296 Repeated falls: Secondary | ICD-10-CM | POA: Diagnosis not present

## 2017-04-15 DIAGNOSIS — I2510 Atherosclerotic heart disease of native coronary artery without angina pectoris: Secondary | ICD-10-CM | POA: Diagnosis not present

## 2017-04-15 DIAGNOSIS — S12690D Other displaced fracture of seventh cervical vertebra, subsequent encounter for fracture with routine healing: Secondary | ICD-10-CM | POA: Diagnosis not present

## 2017-04-15 DIAGNOSIS — Z9181 History of falling: Secondary | ICD-10-CM | POA: Diagnosis not present

## 2017-04-15 DIAGNOSIS — I110 Hypertensive heart disease with heart failure: Secondary | ICD-10-CM | POA: Diagnosis not present

## 2017-04-15 DIAGNOSIS — I509 Heart failure, unspecified: Secondary | ICD-10-CM | POA: Diagnosis not present

## 2017-04-15 DIAGNOSIS — Z981 Arthrodesis status: Secondary | ICD-10-CM | POA: Diagnosis not present

## 2017-04-15 DIAGNOSIS — S12490D Other displaced fracture of fifth cervical vertebra, subsequent encounter for fracture with routine healing: Secondary | ICD-10-CM | POA: Diagnosis not present

## 2017-04-15 DIAGNOSIS — Z7982 Long term (current) use of aspirin: Secondary | ICD-10-CM | POA: Diagnosis not present

## 2017-04-15 DIAGNOSIS — S12590D Other displaced fracture of sixth cervical vertebra, subsequent encounter for fracture with routine healing: Secondary | ICD-10-CM | POA: Diagnosis not present

## 2017-04-17 DIAGNOSIS — I2510 Atherosclerotic heart disease of native coronary artery without angina pectoris: Secondary | ICD-10-CM | POA: Diagnosis not present

## 2017-04-17 DIAGNOSIS — Z23 Encounter for immunization: Secondary | ICD-10-CM | POA: Diagnosis not present

## 2017-04-17 DIAGNOSIS — I10 Essential (primary) hypertension: Secondary | ICD-10-CM | POA: Diagnosis not present

## 2017-04-17 DIAGNOSIS — Z9181 History of falling: Secondary | ICD-10-CM | POA: Diagnosis not present

## 2017-04-20 DIAGNOSIS — W19XXXD Unspecified fall, subsequent encounter: Secondary | ICD-10-CM | POA: Diagnosis not present

## 2017-04-20 DIAGNOSIS — I110 Hypertensive heart disease with heart failure: Secondary | ICD-10-CM | POA: Diagnosis not present

## 2017-04-20 DIAGNOSIS — I2510 Atherosclerotic heart disease of native coronary artery without angina pectoris: Secondary | ICD-10-CM | POA: Diagnosis not present

## 2017-04-20 DIAGNOSIS — S12590D Other displaced fracture of sixth cervical vertebra, subsequent encounter for fracture with routine healing: Secondary | ICD-10-CM | POA: Diagnosis not present

## 2017-04-20 DIAGNOSIS — R296 Repeated falls: Secondary | ICD-10-CM | POA: Diagnosis not present

## 2017-04-20 DIAGNOSIS — S12490D Other displaced fracture of fifth cervical vertebra, subsequent encounter for fracture with routine healing: Secondary | ICD-10-CM | POA: Diagnosis not present

## 2017-04-20 DIAGNOSIS — Z9181 History of falling: Secondary | ICD-10-CM | POA: Diagnosis not present

## 2017-04-20 DIAGNOSIS — I509 Heart failure, unspecified: Secondary | ICD-10-CM | POA: Diagnosis not present

## 2017-04-20 DIAGNOSIS — S12690D Other displaced fracture of seventh cervical vertebra, subsequent encounter for fracture with routine healing: Secondary | ICD-10-CM | POA: Diagnosis not present

## 2017-04-20 DIAGNOSIS — Z7982 Long term (current) use of aspirin: Secondary | ICD-10-CM | POA: Diagnosis not present

## 2017-04-20 DIAGNOSIS — Z981 Arthrodesis status: Secondary | ICD-10-CM | POA: Diagnosis not present

## 2017-04-21 DIAGNOSIS — W19XXXD Unspecified fall, subsequent encounter: Secondary | ICD-10-CM | POA: Diagnosis not present

## 2017-04-21 DIAGNOSIS — I2510 Atherosclerotic heart disease of native coronary artery without angina pectoris: Secondary | ICD-10-CM | POA: Diagnosis not present

## 2017-04-21 DIAGNOSIS — Z981 Arthrodesis status: Secondary | ICD-10-CM | POA: Diagnosis not present

## 2017-04-21 DIAGNOSIS — S12590D Other displaced fracture of sixth cervical vertebra, subsequent encounter for fracture with routine healing: Secondary | ICD-10-CM | POA: Diagnosis not present

## 2017-04-21 DIAGNOSIS — S12690D Other displaced fracture of seventh cervical vertebra, subsequent encounter for fracture with routine healing: Secondary | ICD-10-CM | POA: Diagnosis not present

## 2017-04-21 DIAGNOSIS — Z7982 Long term (current) use of aspirin: Secondary | ICD-10-CM | POA: Diagnosis not present

## 2017-04-21 DIAGNOSIS — S12490D Other displaced fracture of fifth cervical vertebra, subsequent encounter for fracture with routine healing: Secondary | ICD-10-CM | POA: Diagnosis not present

## 2017-04-21 DIAGNOSIS — I110 Hypertensive heart disease with heart failure: Secondary | ICD-10-CM | POA: Diagnosis not present

## 2017-04-21 DIAGNOSIS — R296 Repeated falls: Secondary | ICD-10-CM | POA: Diagnosis not present

## 2017-04-21 DIAGNOSIS — I509 Heart failure, unspecified: Secondary | ICD-10-CM | POA: Diagnosis not present

## 2017-04-21 DIAGNOSIS — Z9181 History of falling: Secondary | ICD-10-CM | POA: Diagnosis not present

## 2017-04-22 DIAGNOSIS — S12690D Other displaced fracture of seventh cervical vertebra, subsequent encounter for fracture with routine healing: Secondary | ICD-10-CM | POA: Diagnosis not present

## 2017-04-22 DIAGNOSIS — I110 Hypertensive heart disease with heart failure: Secondary | ICD-10-CM | POA: Diagnosis not present

## 2017-04-22 DIAGNOSIS — W19XXXD Unspecified fall, subsequent encounter: Secondary | ICD-10-CM | POA: Diagnosis not present

## 2017-04-22 DIAGNOSIS — Z7982 Long term (current) use of aspirin: Secondary | ICD-10-CM | POA: Diagnosis not present

## 2017-04-22 DIAGNOSIS — S12490D Other displaced fracture of fifth cervical vertebra, subsequent encounter for fracture with routine healing: Secondary | ICD-10-CM | POA: Diagnosis not present

## 2017-04-22 DIAGNOSIS — S12590D Other displaced fracture of sixth cervical vertebra, subsequent encounter for fracture with routine healing: Secondary | ICD-10-CM | POA: Diagnosis not present

## 2017-04-22 DIAGNOSIS — I2510 Atherosclerotic heart disease of native coronary artery without angina pectoris: Secondary | ICD-10-CM | POA: Diagnosis not present

## 2017-04-22 DIAGNOSIS — Z9181 History of falling: Secondary | ICD-10-CM | POA: Diagnosis not present

## 2017-04-22 DIAGNOSIS — I509 Heart failure, unspecified: Secondary | ICD-10-CM | POA: Diagnosis not present

## 2017-04-22 DIAGNOSIS — R296 Repeated falls: Secondary | ICD-10-CM | POA: Diagnosis not present

## 2017-04-22 DIAGNOSIS — Z981 Arthrodesis status: Secondary | ICD-10-CM | POA: Diagnosis not present

## 2017-04-23 DIAGNOSIS — I110 Hypertensive heart disease with heart failure: Secondary | ICD-10-CM | POA: Diagnosis not present

## 2017-04-23 DIAGNOSIS — Z7982 Long term (current) use of aspirin: Secondary | ICD-10-CM | POA: Diagnosis not present

## 2017-04-23 DIAGNOSIS — I509 Heart failure, unspecified: Secondary | ICD-10-CM | POA: Diagnosis not present

## 2017-04-23 DIAGNOSIS — S12690D Other displaced fracture of seventh cervical vertebra, subsequent encounter for fracture with routine healing: Secondary | ICD-10-CM | POA: Diagnosis not present

## 2017-04-23 DIAGNOSIS — Z981 Arthrodesis status: Secondary | ICD-10-CM | POA: Diagnosis not present

## 2017-04-23 DIAGNOSIS — I2510 Atherosclerotic heart disease of native coronary artery without angina pectoris: Secondary | ICD-10-CM | POA: Diagnosis not present

## 2017-04-23 DIAGNOSIS — R296 Repeated falls: Secondary | ICD-10-CM | POA: Diagnosis not present

## 2017-04-23 DIAGNOSIS — S12490D Other displaced fracture of fifth cervical vertebra, subsequent encounter for fracture with routine healing: Secondary | ICD-10-CM | POA: Diagnosis not present

## 2017-04-23 DIAGNOSIS — Z9181 History of falling: Secondary | ICD-10-CM | POA: Diagnosis not present

## 2017-04-23 DIAGNOSIS — S12590D Other displaced fracture of sixth cervical vertebra, subsequent encounter for fracture with routine healing: Secondary | ICD-10-CM | POA: Diagnosis not present

## 2017-04-23 DIAGNOSIS — W19XXXD Unspecified fall, subsequent encounter: Secondary | ICD-10-CM | POA: Diagnosis not present

## 2017-04-24 DIAGNOSIS — S12690D Other displaced fracture of seventh cervical vertebra, subsequent encounter for fracture with routine healing: Secondary | ICD-10-CM | POA: Diagnosis not present

## 2017-04-24 DIAGNOSIS — R296 Repeated falls: Secondary | ICD-10-CM | POA: Diagnosis not present

## 2017-04-24 DIAGNOSIS — W19XXXD Unspecified fall, subsequent encounter: Secondary | ICD-10-CM | POA: Diagnosis not present

## 2017-04-24 DIAGNOSIS — Z7982 Long term (current) use of aspirin: Secondary | ICD-10-CM | POA: Diagnosis not present

## 2017-04-24 DIAGNOSIS — Z981 Arthrodesis status: Secondary | ICD-10-CM | POA: Diagnosis not present

## 2017-04-24 DIAGNOSIS — S12590D Other displaced fracture of sixth cervical vertebra, subsequent encounter for fracture with routine healing: Secondary | ICD-10-CM | POA: Diagnosis not present

## 2017-04-24 DIAGNOSIS — I509 Heart failure, unspecified: Secondary | ICD-10-CM | POA: Diagnosis not present

## 2017-04-24 DIAGNOSIS — I110 Hypertensive heart disease with heart failure: Secondary | ICD-10-CM | POA: Diagnosis not present

## 2017-04-24 DIAGNOSIS — I2510 Atherosclerotic heart disease of native coronary artery without angina pectoris: Secondary | ICD-10-CM | POA: Diagnosis not present

## 2017-04-24 DIAGNOSIS — S12490D Other displaced fracture of fifth cervical vertebra, subsequent encounter for fracture with routine healing: Secondary | ICD-10-CM | POA: Diagnosis not present

## 2017-04-24 DIAGNOSIS — Z9181 History of falling: Secondary | ICD-10-CM | POA: Diagnosis not present

## 2017-04-28 DIAGNOSIS — Z9181 History of falling: Secondary | ICD-10-CM | POA: Diagnosis not present

## 2017-04-28 DIAGNOSIS — I2510 Atherosclerotic heart disease of native coronary artery without angina pectoris: Secondary | ICD-10-CM | POA: Diagnosis not present

## 2017-04-28 DIAGNOSIS — Z981 Arthrodesis status: Secondary | ICD-10-CM | POA: Diagnosis not present

## 2017-04-28 DIAGNOSIS — R296 Repeated falls: Secondary | ICD-10-CM | POA: Diagnosis not present

## 2017-04-28 DIAGNOSIS — S12690D Other displaced fracture of seventh cervical vertebra, subsequent encounter for fracture with routine healing: Secondary | ICD-10-CM | POA: Diagnosis not present

## 2017-04-28 DIAGNOSIS — S12490D Other displaced fracture of fifth cervical vertebra, subsequent encounter for fracture with routine healing: Secondary | ICD-10-CM | POA: Diagnosis not present

## 2017-04-28 DIAGNOSIS — I110 Hypertensive heart disease with heart failure: Secondary | ICD-10-CM | POA: Diagnosis not present

## 2017-04-28 DIAGNOSIS — W19XXXD Unspecified fall, subsequent encounter: Secondary | ICD-10-CM | POA: Diagnosis not present

## 2017-04-28 DIAGNOSIS — I509 Heart failure, unspecified: Secondary | ICD-10-CM | POA: Diagnosis not present

## 2017-04-28 DIAGNOSIS — Z7982 Long term (current) use of aspirin: Secondary | ICD-10-CM | POA: Diagnosis not present

## 2017-04-28 DIAGNOSIS — S12590D Other displaced fracture of sixth cervical vertebra, subsequent encounter for fracture with routine healing: Secondary | ICD-10-CM | POA: Diagnosis not present

## 2017-04-29 DIAGNOSIS — S12690D Other displaced fracture of seventh cervical vertebra, subsequent encounter for fracture with routine healing: Secondary | ICD-10-CM | POA: Diagnosis not present

## 2017-04-29 DIAGNOSIS — R296 Repeated falls: Secondary | ICD-10-CM | POA: Diagnosis not present

## 2017-04-29 DIAGNOSIS — W19XXXD Unspecified fall, subsequent encounter: Secondary | ICD-10-CM | POA: Diagnosis not present

## 2017-04-29 DIAGNOSIS — Z9181 History of falling: Secondary | ICD-10-CM | POA: Diagnosis not present

## 2017-04-29 DIAGNOSIS — I110 Hypertensive heart disease with heart failure: Secondary | ICD-10-CM | POA: Diagnosis not present

## 2017-04-29 DIAGNOSIS — I2510 Atherosclerotic heart disease of native coronary artery without angina pectoris: Secondary | ICD-10-CM | POA: Diagnosis not present

## 2017-04-29 DIAGNOSIS — S12590D Other displaced fracture of sixth cervical vertebra, subsequent encounter for fracture with routine healing: Secondary | ICD-10-CM | POA: Diagnosis not present

## 2017-04-29 DIAGNOSIS — Z981 Arthrodesis status: Secondary | ICD-10-CM | POA: Diagnosis not present

## 2017-04-29 DIAGNOSIS — S12490D Other displaced fracture of fifth cervical vertebra, subsequent encounter for fracture with routine healing: Secondary | ICD-10-CM | POA: Diagnosis not present

## 2017-04-29 DIAGNOSIS — I509 Heart failure, unspecified: Secondary | ICD-10-CM | POA: Diagnosis not present

## 2017-04-29 DIAGNOSIS — Z7982 Long term (current) use of aspirin: Secondary | ICD-10-CM | POA: Diagnosis not present

## 2017-04-30 DIAGNOSIS — W19XXXD Unspecified fall, subsequent encounter: Secondary | ICD-10-CM | POA: Diagnosis not present

## 2017-04-30 DIAGNOSIS — S12590D Other displaced fracture of sixth cervical vertebra, subsequent encounter for fracture with routine healing: Secondary | ICD-10-CM | POA: Diagnosis not present

## 2017-04-30 DIAGNOSIS — Z9181 History of falling: Secondary | ICD-10-CM | POA: Diagnosis not present

## 2017-04-30 DIAGNOSIS — I2510 Atherosclerotic heart disease of native coronary artery without angina pectoris: Secondary | ICD-10-CM | POA: Diagnosis not present

## 2017-04-30 DIAGNOSIS — I110 Hypertensive heart disease with heart failure: Secondary | ICD-10-CM | POA: Diagnosis not present

## 2017-04-30 DIAGNOSIS — S12490D Other displaced fracture of fifth cervical vertebra, subsequent encounter for fracture with routine healing: Secondary | ICD-10-CM | POA: Diagnosis not present

## 2017-04-30 DIAGNOSIS — Z981 Arthrodesis status: Secondary | ICD-10-CM | POA: Diagnosis not present

## 2017-04-30 DIAGNOSIS — Z7982 Long term (current) use of aspirin: Secondary | ICD-10-CM | POA: Diagnosis not present

## 2017-04-30 DIAGNOSIS — R296 Repeated falls: Secondary | ICD-10-CM | POA: Diagnosis not present

## 2017-04-30 DIAGNOSIS — I509 Heart failure, unspecified: Secondary | ICD-10-CM | POA: Diagnosis not present

## 2017-04-30 DIAGNOSIS — S12690D Other displaced fracture of seventh cervical vertebra, subsequent encounter for fracture with routine healing: Secondary | ICD-10-CM | POA: Diagnosis not present

## 2017-05-01 DIAGNOSIS — S12490D Other displaced fracture of fifth cervical vertebra, subsequent encounter for fracture with routine healing: Secondary | ICD-10-CM | POA: Diagnosis not present

## 2017-05-01 DIAGNOSIS — Z9181 History of falling: Secondary | ICD-10-CM | POA: Diagnosis not present

## 2017-05-01 DIAGNOSIS — Z981 Arthrodesis status: Secondary | ICD-10-CM | POA: Diagnosis not present

## 2017-05-01 DIAGNOSIS — I509 Heart failure, unspecified: Secondary | ICD-10-CM | POA: Diagnosis not present

## 2017-05-01 DIAGNOSIS — W19XXXD Unspecified fall, subsequent encounter: Secondary | ICD-10-CM | POA: Diagnosis not present

## 2017-05-01 DIAGNOSIS — R296 Repeated falls: Secondary | ICD-10-CM | POA: Diagnosis not present

## 2017-05-01 DIAGNOSIS — I2510 Atherosclerotic heart disease of native coronary artery without angina pectoris: Secondary | ICD-10-CM | POA: Diagnosis not present

## 2017-05-01 DIAGNOSIS — Z961 Presence of intraocular lens: Secondary | ICD-10-CM | POA: Diagnosis not present

## 2017-05-01 DIAGNOSIS — M542 Cervicalgia: Secondary | ICD-10-CM | POA: Diagnosis not present

## 2017-05-01 DIAGNOSIS — Z7982 Long term (current) use of aspirin: Secondary | ICD-10-CM | POA: Diagnosis not present

## 2017-05-01 DIAGNOSIS — H43811 Vitreous degeneration, right eye: Secondary | ICD-10-CM | POA: Diagnosis not present

## 2017-05-01 DIAGNOSIS — H35341 Macular cyst, hole, or pseudohole, right eye: Secondary | ICD-10-CM | POA: Diagnosis not present

## 2017-05-01 DIAGNOSIS — S12690D Other displaced fracture of seventh cervical vertebra, subsequent encounter for fracture with routine healing: Secondary | ICD-10-CM | POA: Diagnosis not present

## 2017-05-01 DIAGNOSIS — H43822 Vitreomacular adhesion, left eye: Secondary | ICD-10-CM | POA: Diagnosis not present

## 2017-05-01 DIAGNOSIS — S12590D Other displaced fracture of sixth cervical vertebra, subsequent encounter for fracture with routine healing: Secondary | ICD-10-CM | POA: Diagnosis not present

## 2017-05-01 DIAGNOSIS — I110 Hypertensive heart disease with heart failure: Secondary | ICD-10-CM | POA: Diagnosis not present

## 2017-05-05 DIAGNOSIS — Z7982 Long term (current) use of aspirin: Secondary | ICD-10-CM | POA: Diagnosis not present

## 2017-05-05 DIAGNOSIS — I2510 Atherosclerotic heart disease of native coronary artery without angina pectoris: Secondary | ICD-10-CM | POA: Diagnosis not present

## 2017-05-05 DIAGNOSIS — Z981 Arthrodesis status: Secondary | ICD-10-CM | POA: Diagnosis not present

## 2017-05-05 DIAGNOSIS — R296 Repeated falls: Secondary | ICD-10-CM | POA: Diagnosis not present

## 2017-05-05 DIAGNOSIS — I509 Heart failure, unspecified: Secondary | ICD-10-CM | POA: Diagnosis not present

## 2017-05-05 DIAGNOSIS — S12690D Other displaced fracture of seventh cervical vertebra, subsequent encounter for fracture with routine healing: Secondary | ICD-10-CM | POA: Diagnosis not present

## 2017-05-05 DIAGNOSIS — I110 Hypertensive heart disease with heart failure: Secondary | ICD-10-CM | POA: Diagnosis not present

## 2017-05-05 DIAGNOSIS — Z9181 History of falling: Secondary | ICD-10-CM | POA: Diagnosis not present

## 2017-05-05 DIAGNOSIS — S12590D Other displaced fracture of sixth cervical vertebra, subsequent encounter for fracture with routine healing: Secondary | ICD-10-CM | POA: Diagnosis not present

## 2017-05-05 DIAGNOSIS — S12490D Other displaced fracture of fifth cervical vertebra, subsequent encounter for fracture with routine healing: Secondary | ICD-10-CM | POA: Diagnosis not present

## 2017-05-05 DIAGNOSIS — W19XXXD Unspecified fall, subsequent encounter: Secondary | ICD-10-CM | POA: Diagnosis not present

## 2017-05-06 DIAGNOSIS — S12490D Other displaced fracture of fifth cervical vertebra, subsequent encounter for fracture with routine healing: Secondary | ICD-10-CM | POA: Diagnosis not present

## 2017-05-06 DIAGNOSIS — I509 Heart failure, unspecified: Secondary | ICD-10-CM | POA: Diagnosis not present

## 2017-05-06 DIAGNOSIS — W19XXXD Unspecified fall, subsequent encounter: Secondary | ICD-10-CM | POA: Diagnosis not present

## 2017-05-06 DIAGNOSIS — I2510 Atherosclerotic heart disease of native coronary artery without angina pectoris: Secondary | ICD-10-CM | POA: Diagnosis not present

## 2017-05-06 DIAGNOSIS — R296 Repeated falls: Secondary | ICD-10-CM | POA: Diagnosis not present

## 2017-05-06 DIAGNOSIS — I110 Hypertensive heart disease with heart failure: Secondary | ICD-10-CM | POA: Diagnosis not present

## 2017-05-06 DIAGNOSIS — Z9181 History of falling: Secondary | ICD-10-CM | POA: Diagnosis not present

## 2017-05-06 DIAGNOSIS — S12690D Other displaced fracture of seventh cervical vertebra, subsequent encounter for fracture with routine healing: Secondary | ICD-10-CM | POA: Diagnosis not present

## 2017-05-06 DIAGNOSIS — Z981 Arthrodesis status: Secondary | ICD-10-CM | POA: Diagnosis not present

## 2017-05-06 DIAGNOSIS — S12590D Other displaced fracture of sixth cervical vertebra, subsequent encounter for fracture with routine healing: Secondary | ICD-10-CM | POA: Diagnosis not present

## 2017-05-06 DIAGNOSIS — Z7982 Long term (current) use of aspirin: Secondary | ICD-10-CM | POA: Diagnosis not present

## 2017-05-07 DIAGNOSIS — I110 Hypertensive heart disease with heart failure: Secondary | ICD-10-CM | POA: Diagnosis not present

## 2017-05-07 DIAGNOSIS — Z981 Arthrodesis status: Secondary | ICD-10-CM | POA: Diagnosis not present

## 2017-05-07 DIAGNOSIS — S12490D Other displaced fracture of fifth cervical vertebra, subsequent encounter for fracture with routine healing: Secondary | ICD-10-CM | POA: Diagnosis not present

## 2017-05-07 DIAGNOSIS — S12590D Other displaced fracture of sixth cervical vertebra, subsequent encounter for fracture with routine healing: Secondary | ICD-10-CM | POA: Diagnosis not present

## 2017-05-07 DIAGNOSIS — S12690D Other displaced fracture of seventh cervical vertebra, subsequent encounter for fracture with routine healing: Secondary | ICD-10-CM | POA: Diagnosis not present

## 2017-05-07 DIAGNOSIS — Z9181 History of falling: Secondary | ICD-10-CM | POA: Diagnosis not present

## 2017-05-07 DIAGNOSIS — R296 Repeated falls: Secondary | ICD-10-CM | POA: Diagnosis not present

## 2017-05-07 DIAGNOSIS — W19XXXD Unspecified fall, subsequent encounter: Secondary | ICD-10-CM | POA: Diagnosis not present

## 2017-05-07 DIAGNOSIS — I2510 Atherosclerotic heart disease of native coronary artery without angina pectoris: Secondary | ICD-10-CM | POA: Diagnosis not present

## 2017-05-07 DIAGNOSIS — I509 Heart failure, unspecified: Secondary | ICD-10-CM | POA: Diagnosis not present

## 2017-05-07 DIAGNOSIS — Z7982 Long term (current) use of aspirin: Secondary | ICD-10-CM | POA: Diagnosis not present

## 2017-05-08 DIAGNOSIS — I2510 Atherosclerotic heart disease of native coronary artery without angina pectoris: Secondary | ICD-10-CM | POA: Diagnosis not present

## 2017-05-08 DIAGNOSIS — Z7982 Long term (current) use of aspirin: Secondary | ICD-10-CM | POA: Diagnosis not present

## 2017-05-08 DIAGNOSIS — S12690D Other displaced fracture of seventh cervical vertebra, subsequent encounter for fracture with routine healing: Secondary | ICD-10-CM | POA: Diagnosis not present

## 2017-05-08 DIAGNOSIS — I110 Hypertensive heart disease with heart failure: Secondary | ICD-10-CM | POA: Diagnosis not present

## 2017-05-08 DIAGNOSIS — Z9181 History of falling: Secondary | ICD-10-CM | POA: Diagnosis not present

## 2017-05-08 DIAGNOSIS — W19XXXD Unspecified fall, subsequent encounter: Secondary | ICD-10-CM | POA: Diagnosis not present

## 2017-05-08 DIAGNOSIS — I509 Heart failure, unspecified: Secondary | ICD-10-CM | POA: Diagnosis not present

## 2017-05-08 DIAGNOSIS — S12590D Other displaced fracture of sixth cervical vertebra, subsequent encounter for fracture with routine healing: Secondary | ICD-10-CM | POA: Diagnosis not present

## 2017-05-08 DIAGNOSIS — R296 Repeated falls: Secondary | ICD-10-CM | POA: Diagnosis not present

## 2017-05-08 DIAGNOSIS — Z981 Arthrodesis status: Secondary | ICD-10-CM | POA: Diagnosis not present

## 2017-05-08 DIAGNOSIS — S12490D Other displaced fracture of fifth cervical vertebra, subsequent encounter for fracture with routine healing: Secondary | ICD-10-CM | POA: Diagnosis not present

## 2017-05-12 DIAGNOSIS — R296 Repeated falls: Secondary | ICD-10-CM | POA: Diagnosis not present

## 2017-05-12 DIAGNOSIS — Z9181 History of falling: Secondary | ICD-10-CM | POA: Diagnosis not present

## 2017-05-12 DIAGNOSIS — S12690D Other displaced fracture of seventh cervical vertebra, subsequent encounter for fracture with routine healing: Secondary | ICD-10-CM | POA: Diagnosis not present

## 2017-05-12 DIAGNOSIS — S12490D Other displaced fracture of fifth cervical vertebra, subsequent encounter for fracture with routine healing: Secondary | ICD-10-CM | POA: Diagnosis not present

## 2017-05-12 DIAGNOSIS — I2510 Atherosclerotic heart disease of native coronary artery without angina pectoris: Secondary | ICD-10-CM | POA: Diagnosis not present

## 2017-05-12 DIAGNOSIS — Z981 Arthrodesis status: Secondary | ICD-10-CM | POA: Diagnosis not present

## 2017-05-12 DIAGNOSIS — I509 Heart failure, unspecified: Secondary | ICD-10-CM | POA: Diagnosis not present

## 2017-05-12 DIAGNOSIS — S12590D Other displaced fracture of sixth cervical vertebra, subsequent encounter for fracture with routine healing: Secondary | ICD-10-CM | POA: Diagnosis not present

## 2017-05-12 DIAGNOSIS — W19XXXD Unspecified fall, subsequent encounter: Secondary | ICD-10-CM | POA: Diagnosis not present

## 2017-05-12 DIAGNOSIS — I110 Hypertensive heart disease with heart failure: Secondary | ICD-10-CM | POA: Diagnosis not present

## 2017-05-12 DIAGNOSIS — Z7982 Long term (current) use of aspirin: Secondary | ICD-10-CM | POA: Diagnosis not present

## 2017-05-13 DIAGNOSIS — Z7982 Long term (current) use of aspirin: Secondary | ICD-10-CM | POA: Diagnosis not present

## 2017-05-13 DIAGNOSIS — Z981 Arthrodesis status: Secondary | ICD-10-CM | POA: Diagnosis not present

## 2017-05-13 DIAGNOSIS — W19XXXD Unspecified fall, subsequent encounter: Secondary | ICD-10-CM | POA: Diagnosis not present

## 2017-05-13 DIAGNOSIS — I509 Heart failure, unspecified: Secondary | ICD-10-CM | POA: Diagnosis not present

## 2017-05-13 DIAGNOSIS — I2510 Atherosclerotic heart disease of native coronary artery without angina pectoris: Secondary | ICD-10-CM | POA: Diagnosis not present

## 2017-05-13 DIAGNOSIS — R296 Repeated falls: Secondary | ICD-10-CM | POA: Diagnosis not present

## 2017-05-13 DIAGNOSIS — S12490D Other displaced fracture of fifth cervical vertebra, subsequent encounter for fracture with routine healing: Secondary | ICD-10-CM | POA: Diagnosis not present

## 2017-05-13 DIAGNOSIS — S12690D Other displaced fracture of seventh cervical vertebra, subsequent encounter for fracture with routine healing: Secondary | ICD-10-CM | POA: Diagnosis not present

## 2017-05-13 DIAGNOSIS — I110 Hypertensive heart disease with heart failure: Secondary | ICD-10-CM | POA: Diagnosis not present

## 2017-05-13 DIAGNOSIS — Z9181 History of falling: Secondary | ICD-10-CM | POA: Diagnosis not present

## 2017-05-13 DIAGNOSIS — S12590D Other displaced fracture of sixth cervical vertebra, subsequent encounter for fracture with routine healing: Secondary | ICD-10-CM | POA: Diagnosis not present

## 2017-05-19 DIAGNOSIS — I509 Heart failure, unspecified: Secondary | ICD-10-CM | POA: Diagnosis not present

## 2017-05-19 DIAGNOSIS — Z981 Arthrodesis status: Secondary | ICD-10-CM | POA: Diagnosis not present

## 2017-05-19 DIAGNOSIS — S12590D Other displaced fracture of sixth cervical vertebra, subsequent encounter for fracture with routine healing: Secondary | ICD-10-CM | POA: Diagnosis not present

## 2017-05-19 DIAGNOSIS — Z7982 Long term (current) use of aspirin: Secondary | ICD-10-CM | POA: Diagnosis not present

## 2017-05-19 DIAGNOSIS — I110 Hypertensive heart disease with heart failure: Secondary | ICD-10-CM | POA: Diagnosis not present

## 2017-05-19 DIAGNOSIS — S12690D Other displaced fracture of seventh cervical vertebra, subsequent encounter for fracture with routine healing: Secondary | ICD-10-CM | POA: Diagnosis not present

## 2017-05-19 DIAGNOSIS — I2510 Atherosclerotic heart disease of native coronary artery without angina pectoris: Secondary | ICD-10-CM | POA: Diagnosis not present

## 2017-05-19 DIAGNOSIS — S12490D Other displaced fracture of fifth cervical vertebra, subsequent encounter for fracture with routine healing: Secondary | ICD-10-CM | POA: Diagnosis not present

## 2017-05-19 DIAGNOSIS — R296 Repeated falls: Secondary | ICD-10-CM | POA: Diagnosis not present

## 2017-05-19 DIAGNOSIS — W19XXXD Unspecified fall, subsequent encounter: Secondary | ICD-10-CM | POA: Diagnosis not present

## 2017-05-19 DIAGNOSIS — Z9181 History of falling: Secondary | ICD-10-CM | POA: Diagnosis not present

## 2017-05-20 DIAGNOSIS — I110 Hypertensive heart disease with heart failure: Secondary | ICD-10-CM | POA: Diagnosis not present

## 2017-05-20 DIAGNOSIS — I509 Heart failure, unspecified: Secondary | ICD-10-CM | POA: Diagnosis not present

## 2017-05-20 DIAGNOSIS — Z7982 Long term (current) use of aspirin: Secondary | ICD-10-CM | POA: Diagnosis not present

## 2017-05-20 DIAGNOSIS — S12590D Other displaced fracture of sixth cervical vertebra, subsequent encounter for fracture with routine healing: Secondary | ICD-10-CM | POA: Diagnosis not present

## 2017-05-20 DIAGNOSIS — I2510 Atherosclerotic heart disease of native coronary artery without angina pectoris: Secondary | ICD-10-CM | POA: Diagnosis not present

## 2017-05-20 DIAGNOSIS — R296 Repeated falls: Secondary | ICD-10-CM | POA: Diagnosis not present

## 2017-05-20 DIAGNOSIS — S12490D Other displaced fracture of fifth cervical vertebra, subsequent encounter for fracture with routine healing: Secondary | ICD-10-CM | POA: Diagnosis not present

## 2017-05-20 DIAGNOSIS — Z981 Arthrodesis status: Secondary | ICD-10-CM | POA: Diagnosis not present

## 2017-05-20 DIAGNOSIS — Z9181 History of falling: Secondary | ICD-10-CM | POA: Diagnosis not present

## 2017-05-20 DIAGNOSIS — S12690D Other displaced fracture of seventh cervical vertebra, subsequent encounter for fracture with routine healing: Secondary | ICD-10-CM | POA: Diagnosis not present

## 2017-05-20 DIAGNOSIS — W19XXXD Unspecified fall, subsequent encounter: Secondary | ICD-10-CM | POA: Diagnosis not present

## 2017-05-22 DIAGNOSIS — Z981 Arthrodesis status: Secondary | ICD-10-CM | POA: Diagnosis not present

## 2017-05-22 DIAGNOSIS — I509 Heart failure, unspecified: Secondary | ICD-10-CM | POA: Diagnosis not present

## 2017-05-22 DIAGNOSIS — S12490D Other displaced fracture of fifth cervical vertebra, subsequent encounter for fracture with routine healing: Secondary | ICD-10-CM | POA: Diagnosis not present

## 2017-05-22 DIAGNOSIS — I2510 Atherosclerotic heart disease of native coronary artery without angina pectoris: Secondary | ICD-10-CM | POA: Diagnosis not present

## 2017-05-22 DIAGNOSIS — S12690D Other displaced fracture of seventh cervical vertebra, subsequent encounter for fracture with routine healing: Secondary | ICD-10-CM | POA: Diagnosis not present

## 2017-05-22 DIAGNOSIS — Z9181 History of falling: Secondary | ICD-10-CM | POA: Diagnosis not present

## 2017-05-22 DIAGNOSIS — I110 Hypertensive heart disease with heart failure: Secondary | ICD-10-CM | POA: Diagnosis not present

## 2017-05-22 DIAGNOSIS — W19XXXD Unspecified fall, subsequent encounter: Secondary | ICD-10-CM | POA: Diagnosis not present

## 2017-05-22 DIAGNOSIS — S12590D Other displaced fracture of sixth cervical vertebra, subsequent encounter for fracture with routine healing: Secondary | ICD-10-CM | POA: Diagnosis not present

## 2017-05-22 DIAGNOSIS — Z7982 Long term (current) use of aspirin: Secondary | ICD-10-CM | POA: Diagnosis not present

## 2017-05-22 DIAGNOSIS — R296 Repeated falls: Secondary | ICD-10-CM | POA: Diagnosis not present

## 2017-06-05 DIAGNOSIS — I2510 Atherosclerotic heart disease of native coronary artery without angina pectoris: Secondary | ICD-10-CM | POA: Diagnosis not present

## 2017-06-05 DIAGNOSIS — I10 Essential (primary) hypertension: Secondary | ICD-10-CM | POA: Diagnosis not present

## 2017-06-05 DIAGNOSIS — Z8781 Personal history of (healed) traumatic fracture: Secondary | ICD-10-CM | POA: Diagnosis not present

## 2017-06-05 DIAGNOSIS — R1310 Dysphagia, unspecified: Secondary | ICD-10-CM | POA: Diagnosis not present

## 2017-06-05 DIAGNOSIS — E785 Hyperlipidemia, unspecified: Secondary | ICD-10-CM | POA: Diagnosis not present

## 2017-06-05 DIAGNOSIS — I429 Cardiomyopathy, unspecified: Secondary | ICD-10-CM | POA: Diagnosis not present

## 2017-06-05 DIAGNOSIS — Z9889 Other specified postprocedural states: Secondary | ICD-10-CM | POA: Diagnosis not present

## 2017-06-05 DIAGNOSIS — R633 Feeding difficulties: Secondary | ICD-10-CM | POA: Diagnosis not present

## 2017-06-05 DIAGNOSIS — R2689 Other abnormalities of gait and mobility: Secondary | ICD-10-CM | POA: Diagnosis not present

## 2017-06-19 DIAGNOSIS — M542 Cervicalgia: Secondary | ICD-10-CM | POA: Diagnosis not present

## 2017-06-19 DIAGNOSIS — G959 Disease of spinal cord, unspecified: Secondary | ICD-10-CM | POA: Diagnosis not present

## 2017-06-19 DIAGNOSIS — S12591D Other nondisplaced fracture of sixth cervical vertebra, subsequent encounter for fracture with routine healing: Secondary | ICD-10-CM | POA: Diagnosis not present

## 2017-06-22 DIAGNOSIS — R1310 Dysphagia, unspecified: Secondary | ICD-10-CM | POA: Diagnosis not present

## 2017-07-07 DIAGNOSIS — I2510 Atherosclerotic heart disease of native coronary artery without angina pectoris: Secondary | ICD-10-CM | POA: Diagnosis not present

## 2017-07-07 DIAGNOSIS — E7849 Other hyperlipidemia: Secondary | ICD-10-CM | POA: Diagnosis not present

## 2017-07-07 DIAGNOSIS — R011 Cardiac murmur, unspecified: Secondary | ICD-10-CM | POA: Diagnosis not present

## 2017-07-07 DIAGNOSIS — I10 Essential (primary) hypertension: Secondary | ICD-10-CM | POA: Diagnosis not present

## 2017-07-12 ENCOUNTER — Other Ambulatory Visit: Payer: Self-pay | Admitting: Internal Medicine

## 2017-07-31 DIAGNOSIS — I2510 Atherosclerotic heart disease of native coronary artery without angina pectoris: Secondary | ICD-10-CM | POA: Diagnosis not present

## 2017-07-31 DIAGNOSIS — H35341 Macular cyst, hole, or pseudohole, right eye: Secondary | ICD-10-CM | POA: Diagnosis not present

## 2017-07-31 DIAGNOSIS — I10 Essential (primary) hypertension: Secondary | ICD-10-CM | POA: Diagnosis not present

## 2017-07-31 DIAGNOSIS — I5023 Acute on chronic systolic (congestive) heart failure: Secondary | ICD-10-CM | POA: Diagnosis not present

## 2017-07-31 DIAGNOSIS — H524 Presbyopia: Secondary | ICD-10-CM | POA: Diagnosis not present

## 2017-07-31 DIAGNOSIS — H43811 Vitreous degeneration, right eye: Secondary | ICD-10-CM | POA: Diagnosis not present

## 2017-07-31 DIAGNOSIS — Z961 Presence of intraocular lens: Secondary | ICD-10-CM | POA: Diagnosis not present

## 2017-07-31 DIAGNOSIS — H5203 Hypermetropia, bilateral: Secondary | ICD-10-CM | POA: Diagnosis not present

## 2017-07-31 DIAGNOSIS — E7849 Other hyperlipidemia: Secondary | ICD-10-CM | POA: Diagnosis not present

## 2017-07-31 DIAGNOSIS — I5022 Chronic systolic (congestive) heart failure: Secondary | ICD-10-CM | POA: Diagnosis not present

## 2017-09-04 DIAGNOSIS — H903 Sensorineural hearing loss, bilateral: Secondary | ICD-10-CM | POA: Diagnosis not present

## 2017-09-04 DIAGNOSIS — H6123 Impacted cerumen, bilateral: Secondary | ICD-10-CM | POA: Diagnosis not present

## 2017-09-18 DIAGNOSIS — S12591D Other nondisplaced fracture of sixth cervical vertebra, subsequent encounter for fracture with routine healing: Secondary | ICD-10-CM | POA: Diagnosis not present

## 2017-09-18 DIAGNOSIS — S129XXA Fracture of neck, unspecified, initial encounter: Secondary | ICD-10-CM | POA: Diagnosis not present

## 2017-09-18 DIAGNOSIS — M542 Cervicalgia: Secondary | ICD-10-CM | POA: Diagnosis not present

## 2017-10-29 DIAGNOSIS — I10 Essential (primary) hypertension: Secondary | ICD-10-CM | POA: Diagnosis not present

## 2017-10-29 DIAGNOSIS — I5022 Chronic systolic (congestive) heart failure: Secondary | ICD-10-CM | POA: Diagnosis not present

## 2017-10-29 DIAGNOSIS — I2510 Atherosclerotic heart disease of native coronary artery without angina pectoris: Secondary | ICD-10-CM | POA: Diagnosis not present

## 2017-10-29 DIAGNOSIS — E7849 Other hyperlipidemia: Secondary | ICD-10-CM | POA: Diagnosis not present

## 2017-12-23 ENCOUNTER — Other Ambulatory Visit: Payer: Self-pay | Admitting: Internal Medicine

## 2018-01-20 ENCOUNTER — Other Ambulatory Visit: Payer: Self-pay | Admitting: Internal Medicine

## 2018-01-29 DIAGNOSIS — Z0000 Encounter for general adult medical examination without abnormal findings: Secondary | ICD-10-CM | POA: Diagnosis not present

## 2018-01-29 DIAGNOSIS — R413 Other amnesia: Secondary | ICD-10-CM | POA: Diagnosis not present

## 2018-01-29 DIAGNOSIS — I110 Hypertensive heart disease with heart failure: Secondary | ICD-10-CM | POA: Diagnosis not present

## 2018-01-29 DIAGNOSIS — I2510 Atherosclerotic heart disease of native coronary artery without angina pectoris: Secondary | ICD-10-CM | POA: Diagnosis not present

## 2018-01-29 DIAGNOSIS — E785 Hyperlipidemia, unspecified: Secondary | ICD-10-CM | POA: Diagnosis not present

## 2018-01-29 DIAGNOSIS — I5020 Unspecified systolic (congestive) heart failure: Secondary | ICD-10-CM | POA: Diagnosis not present

## 2018-02-01 DIAGNOSIS — I10 Essential (primary) hypertension: Secondary | ICD-10-CM | POA: Diagnosis not present

## 2018-02-01 DIAGNOSIS — I5022 Chronic systolic (congestive) heart failure: Secondary | ICD-10-CM | POA: Diagnosis not present

## 2018-02-01 DIAGNOSIS — I2510 Atherosclerotic heart disease of native coronary artery without angina pectoris: Secondary | ICD-10-CM | POA: Diagnosis not present

## 2018-02-01 DIAGNOSIS — M810 Age-related osteoporosis without current pathological fracture: Secondary | ICD-10-CM | POA: Diagnosis not present

## 2018-02-01 DIAGNOSIS — E785 Hyperlipidemia, unspecified: Secondary | ICD-10-CM | POA: Diagnosis not present

## 2018-02-01 DIAGNOSIS — Z23 Encounter for immunization: Secondary | ICD-10-CM | POA: Diagnosis not present

## 2018-02-01 DIAGNOSIS — R413 Other amnesia: Secondary | ICD-10-CM | POA: Diagnosis not present

## 2018-04-05 DIAGNOSIS — G959 Disease of spinal cord, unspecified: Secondary | ICD-10-CM | POA: Diagnosis not present

## 2018-04-05 DIAGNOSIS — N319 Neuromuscular dysfunction of bladder, unspecified: Secondary | ICD-10-CM | POA: Diagnosis not present

## 2018-04-05 DIAGNOSIS — M542 Cervicalgia: Secondary | ICD-10-CM | POA: Diagnosis not present

## 2018-04-05 DIAGNOSIS — S129XXA Fracture of neck, unspecified, initial encounter: Secondary | ICD-10-CM | POA: Diagnosis not present

## 2018-05-04 DIAGNOSIS — H43811 Vitreous degeneration, right eye: Secondary | ICD-10-CM | POA: Diagnosis not present

## 2018-05-04 DIAGNOSIS — H43822 Vitreomacular adhesion, left eye: Secondary | ICD-10-CM | POA: Diagnosis not present

## 2018-05-04 DIAGNOSIS — H35341 Macular cyst, hole, or pseudohole, right eye: Secondary | ICD-10-CM | POA: Diagnosis not present

## 2018-05-04 DIAGNOSIS — H02831 Dermatochalasis of right upper eyelid: Secondary | ICD-10-CM | POA: Diagnosis not present

## 2018-05-04 DIAGNOSIS — Z961 Presence of intraocular lens: Secondary | ICD-10-CM | POA: Diagnosis not present

## 2020-08-11 DEATH — deceased
